# Patient Record
Sex: Male | Born: 1995
Health system: Southern US, Community
[De-identification: ages and names within clinical notes are randomized; demographics above are authoritative.]

## PROBLEM LIST (undated history)

## (undated) DIAGNOSIS — F319 Bipolar disorder, unspecified: Secondary | ICD-10-CM

## (undated) DIAGNOSIS — F4310 Post-traumatic stress disorder, unspecified: Secondary | ICD-10-CM

## (undated) DIAGNOSIS — F32A Depression, unspecified: Secondary | ICD-10-CM

## (undated) DIAGNOSIS — F329 Major depressive disorder, single episode, unspecified: Secondary | ICD-10-CM

---
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
## 2015-06-04 ENCOUNTER — Encounter (HOSPITAL_COMMUNITY): Payer: Self-pay | Admitting: Emergency Medicine

## 2015-06-04 ENCOUNTER — Emergency Department (HOSPITAL_COMMUNITY)
Admission: EM | Admit: 2015-06-04 | Discharge: 2015-06-04 | Disposition: A | Discharge status: Home / Self Care | Payer: Medicaid Other | Attending: Emergency Medicine | Admitting: Emergency Medicine

## 2015-06-04 DIAGNOSIS — F17200 Nicotine dependence, unspecified, uncomplicated: Secondary | ICD-10-CM | POA: Insufficient documentation

## 2015-06-04 DIAGNOSIS — K219 Gastro-esophageal reflux disease without esophagitis: Secondary | ICD-10-CM | POA: Diagnosis not present

## 2015-06-04 DIAGNOSIS — Z8659 Personal history of other mental and behavioral disorders: Secondary | ICD-10-CM | POA: Diagnosis not present

## 2015-06-04 DIAGNOSIS — R1013 Epigastric pain: Secondary | ICD-10-CM | POA: Diagnosis present

## 2015-06-04 LAB — COMPREHENSIVE METABOLIC PANEL
ALBUMIN: 4.2 g/dL (ref 3.5–5.0)
ALK PHOS: 77 U/L (ref 38–126)
ALT: 22 U/L (ref 17–63)
ANION GAP: 9 (ref 5–15)
AST: 29 U/L (ref 15–41)
BILIRUBIN TOTAL: 0.4 mg/dL (ref 0.3–1.2)
BUN: 23 mg/dL — AB (ref 6–20)
CALCIUM: 9.5 mg/dL (ref 8.9–10.3)
CO2: 25 mmol/L (ref 22–32)
CREATININE: 1.05 mg/dL (ref 0.61–1.24)
Chloride: 107 mmol/L (ref 101–111)
GFR calc Af Amer: >60 mL/min (ref >60)
GFR calc non Af Amer: >60 mL/min (ref >60)
GLUCOSE: 103 mg/dL — AB (ref 65–99)
Potassium: 3.9 mmol/L (ref 3.5–5.1)
Sodium: 141 mmol/L (ref 135–145)
TOTAL PROTEIN: 7.6 g/dL (ref 6.5–8.1)

## 2015-06-04 LAB — LIPASE, BLOOD: Lipase: 22 U/L (ref 11–51)

## 2015-06-04 LAB — CBC
HCT: 41.5 % (ref 39.0–52.0)
Hemoglobin: 13.6 g/dL (ref 13.0–17.0)
MCH: 27.1 pg (ref 26.0–34.0)
MCHC: 32.8 g/dL (ref 30.0–36.0)
MCV: 82.8 fL (ref 78.0–100.0)
PLATELETS: 312 10*3/uL (ref 150–400)
RBC: 5.01 MIL/uL (ref 4.22–5.81)
RDW: 12.9 % (ref 11.5–15.5)
WBC: 7.3 10*3/uL (ref 4.0–10.5)

## 2015-06-04 MED ORDER — PANTOPRAZOLE SODIUM 40 MG PO TBEC: 40.0000 mg | DELAYED_RELEASE_TABLET | Freq: Every day | ORAL | Status: AC

## 2015-06-04 MED ORDER — PANTOPRAZOLE SODIUM 40 MG PO TBEC
40.0000 mg | DELAYED_RELEASE_TABLET | Freq: Once | ORAL | Status: AC
  Administered 2015-06-04: 40 mg via ORAL
  Filled 2015-06-04: qty 1

## 2015-06-04 MED ORDER — GI COCKTAIL ~~LOC~~
30.0000 mL | Freq: Once | ORAL | Status: AC
  Administered 2015-06-04: 30 mL via ORAL
  Filled 2015-06-04: qty 30

## 2015-06-04 NOTE — ED Provider Notes (Signed)
CSN: [id number]     Arrival date & time 06/04/15  0259 History   First MD Initiated Contact with Patient 06/04/15 0344     Chief Complaint  Patient presents with  . Abdominal Pain     (Consider location/radiation/quality/duration/timing/severity/associated sxs/prior Treatment) Patient is a 20 y.o. male presenting with abdominal pain. The history is provided by the patient.  Abdominal Pain He complains of discomfort in his epigastric region for about the last 2 months. There is some radiation to the back. He rates his discomfort at 8/10. He describes it as a pressure feeling. It is worse when he lays down and better when he sits up. It is also worse after eating. He has tried drinking tea with no benefit. He has had some mild nausea but no vomiting. Denies constipation or diarrhea. Discomfort has been getting worse or decided to come to the ED. He hasn't tried no other treatments at home. He states he asked she has been gaining weight recently. He smokes cigarettes occasionally. There is no alcohol use and no NSAID use.  Past Medical History  Diagnosis Date  . Bipolar 1 disorder (HCC)   . Depression   . Post traumatic stress disorder    History reviewed. No pertinent past surgical history. History reviewed. No pertinent family history. Social History  Substance Use Topics  . Smoking status: Current Some Day Smoker  . Smokeless tobacco: None  . Alcohol Use: No    Review of Systems  Gastrointestinal: Positive for abdominal pain.  All other systems reviewed and are negative.     Allergies  Review of patient's allergies indicates no known allergies.  Home Medications   Prior to Admission medications   Medication Sig Start Date End Date Taking? Authorizing Provider  ciprofloxacin (CIPRO) 500 MG tablet Take 1 tablet (500 mg total) by mouth 2 (two) times daily. Patient not taking: Reported on 06/04/2015 08/09/14   Teressa Lower, NP  doxycycline (VIBRAMYCIN) 100 MG capsule Take 1  capsule (100 mg total) by mouth 2 (two) times daily. One po bid x 7 days Patient not taking: Reported on 06/04/2015 10/17/14   April Palumbo, MD   BP 128/113 mmHg  Pulse 78  Temp(Src) 97.8 F (36.6 C) (Oral)  Resp 15  Ht  (1.651 m)  Wt 238 lb (107.956 kg)  BMI 39.61 kg/m2  SpO2 98% Physical Exam  Nursing note and vitals reviewed.  20 year old male, resting comfortably and in no acute distress. Vital signs are significant for hypertension. Oxygen saturation is 98%, which is normal. Head is normocephalic and atraumatic. PERRLA, EOMI. Oropharynx is clear. Neck is nontender and supple without adenopathy or JVD. Back is nontender and there is no CVA tenderness. Lungs are clear without rales, wheezes, or rhonchi. Chest is nontender. Heart has regular rate and rhythm without murmur. Abdomen is soft, flat, with moderate epigastric tenderness. There is no rebound or guarding. There are no masses or hepatosplenomegaly and peristalsis is normoactive. Extremities have no cyanosis or edema, full range of motion is present. Skin is warm and dry without rash. Neurologic: Mental status is normal, cranial nerves are intact, there are no motor or sensory deficits.  ED Course  Procedures (including critical care time) Labs Review Results for orders placed or performed during the hospital encounter of 06/04/15  Lipase, blood  Result Value Ref Range   Lipase 22 11 - 51 U/L  Comprehensive metabolic panel  Result Value Ref Range   Sodium 141 135 - 145 mmol/L  Potassium 3.9 3.5 - 5.1 mmol/L   Chloride 107 101 - 111 mmol/L   CO2 25 22 - 32 mmol/L   Glucose, Bld 103 (H) 65 - 99 mg/dL   BUN 23 (H) 6 - 20 mg/dL   Creatinine, Ser 1.61 0.61 - 1.24 mg/dL   Calcium 9.5 8.9 - 09.6 mg/dL   Total Protein 7.6 6.5 - 8.1 g/dL   Albumin 4.2 3.5 - 5.0 g/dL   AST 29 15 - 41 U/L   ALT 22 17 - 63 U/L   Alkaline Phosphatase 77 38 - 126 U/L   Total Bilirubin 0.4 0.3 - 1.2 mg/dL   GFR calc non Af Amer >60 >60  mL/min   GFR calc Af Amer >60 >60 mL/min   Anion gap 9 5 - 15  CBC  Result Value Ref Range   WBC 7.3 4.0 - 10.5 K/uL   RBC 5.01 4.22 - 5.81 MIL/uL   Hemoglobin 13.6 13.0 - 17.0 g/dL   HCT 04.5 40.9 - 81.1 %   MCV 82.8 78.0 - 100.0 fL   MCH 27.1 26.0 - 34.0 pg   MCHC 32.8 30.0 - 36.0 g/dL   RDW 91.4 78.2 - 95.6 %   Platelets 312 150 - 400 K/uL   I have personally reviewed and evaluated these lab results as part of my medical decision-making.   MDM   Final diagnoses:  GERD without esophagitis    Epigastric discomfort in pattern consistent with gastroesophageal reflux disease, possible early peptic ulcer disease. Laboratory workup is unremarkable. He will be given a therapeutic trial of a GI cocktail. Old records are reviewed and there are no relevant past visits.  He had excellent relief with GI cocktail. He is discharged with prescription for pantoprazole.  Dione Booze, MD 06/04/15 [phone]

## 2015-06-04 NOTE — ED Notes (Signed)
Patient complaining of abdominal pain. Patient has had this pain since November. Patient has nausea. No complaint of diarrhea or vomiting.

## 2015-06-04 NOTE — ED Notes (Signed)
Patient requested urine.

## 2015-06-04 NOTE — ED Notes (Signed)
Bed: [facility] Expected date:  Expected time:  Means of arrival:  Comments: EMS 44M abd pain x 1 month

## 2015-06-04 NOTE — Discharge Instructions (Signed)
Gastroesophageal Reflux Disease, Adult Normally, food travels down the esophagus and stays in the stomach to be digested. However, when a person has gastroesophageal reflux disease (GERD), food and stomach acid move back up into the esophagus. When this happens, the esophagus becomes sore and inflamed. Over time, GERD can create small holes (ulcers) in the lining of the esophagus.  CAUSES This condition is caused by a problem with the muscle between the esophagus and the stomach (lower esophageal sphincter, or LES). Normally, the LES muscle closes after food passes through the esophagus to the stomach. When the LES is weakened or abnormal, it does not close properly, and that allows food and stomach acid to go back up into the esophagus. The LES can be weakened by certain dietary substances, medicines, and medical conditions, including:  Tobacco use.  Pregnancy.  Having a hiatal hernia.  Heavy alcohol use.  Certain foods and beverages, such as coffee, chocolate, onions, and peppermint. RISK FACTORS This condition is more likely to develop in:  People who have an increased body weight.  People who have connective tissue disorders.  People who use NSAID medicines. SYMPTOMS Symptoms of this condition include:  Heartburn.  Difficult or painful swallowing.  The feeling of having a lump in the throat.  Abitter taste in the mouth.  Bad breath.  Having a large amount of saliva.  Having an upset or bloated stomach.  Belching.  Chest pain.  Shortness of breath or wheezing.  Ongoing (chronic) cough or a night-time cough.  Wearing away of tooth enamel.  Weight loss. Different conditions can cause chest pain. Make sure to see your health care provider if you experience chest pain. DIAGNOSIS Your health care provider will take a medical history and perform a physical exam. To determine if you have mild or severe GERD, your health care provider may also monitor how you respond  to treatment. You may also have other tests, including:  An endoscopy toexamine your stomach and esophagus with a small camera.  A test thatmeasures the acidity level in your esophagus.  A test thatmeasures how much pressure is on your esophagus.  A barium swallow or modified barium swallow to show the shape, size, and functioning of your esophagus. TREATMENT The goal of treatment is to help relieve your symptoms and to prevent complications. Treatment for this condition may vary depending on how severe your symptoms are. Your health care provider may recommend:  Changes to your diet.  Medicine.  Surgery. HOME CARE INSTRUCTIONS Diet  Follow a diet as recommended by your health care provider. This may involve avoiding foods and drinks such as:  Coffee and tea (with or without caffeine).  Drinks that containalcohol.  Energy drinks and sports drinks.  Carbonated drinks or sodas.  Chocolate and cocoa.  Peppermint and mint flavorings.  Garlic and onions.  Horseradish.  Spicy and acidic foods, including peppers, chili powder, curry powder, vinegar, hot sauces, and barbecue sauce.  Citrus fruit juices and citrus fruits, such as oranges, lemons, and limes.  Tomato-based foods, such as red sauce, chili, salsa, and pizza with red sauce.  Fried and fatty foods, such as donuts, french fries, potato chips, and high-fat dressings.  High-fat meats, such as hot dogs and fatty cuts of red and white meats, such as rib eye steak, sausage, ham, and bacon.  High-fat dairy items, such as whole milk, butter, and cream cheese.  Eat small, frequent meals instead of large meals.  Avoid drinking large amounts of liquid with your   meals.  Avoid eating meals during the 2-3 hours before bedtime.  Avoid lying down right after you eat.  Do not exercise right after you eat. General Instructions  Pay attention to any changes in your symptoms.  Take over-the-counter and prescription  medicines only as told by your health care provider. Do not take aspirin, ibuprofen, or other NSAIDs unless your health care provider told you to do so.  Do not use any tobacco products, including cigarettes, chewing tobacco, and e-cigarettes. If you need help quitting, ask your health care provider.  Wear loose-fitting clothing. Do not wear anything tight around your waist that causes pressure on your abdomen.  Raise (elevate) the head of your bed 6 inches (15cm).  Try to reduce your stress, such as with yoga or meditation. If you need help reducing stress, ask your health care provider.  If you are overweight, reduce your weight to an amount that is healthy for you. Ask your health care provider for guidance about a safe weight loss goal.  Keep all follow-up visits as told by your health care provider. This is important. SEEK MEDICAL CARE IF:  You have new symptoms.  You have unexplained weight loss.  You have difficulty swallowing, or it hurts to swallow.  You have wheezing or a persistent cough.  Your symptoms do not improve with treatment.  You have a hoarse voice. SEEK IMMEDIATE MEDICAL CARE IF:  You have pain in your arms, neck, jaw, teeth, or back.  You feel sweaty, dizzy, or light-headed.  You have chest pain or shortness of breath.  You vomit and your vomit looks like blood or coffee grounds.  You faint.  Your stool is bloody or black.  You cannot swallow, drink, or eat.   This information is not intended to replace advice given to you by your health care provider. Make sure you discuss any questions you have with your health care provider.   Document Released: 02/07/2005 Document Revised: 01/19/2015 Document Reviewed: 08/25/2014 Elsevier Interactive Patient Education 2016 [employer]  Pantoprazole tablets What is this medicine? PANTOPRAZOLE (pan TOE pra zole) prevents the production of acid in the stomach. It is used to treat gastroesophageal reflux  disease (GERD), inflammation of the esophagus, and Zollinger-Ellison syndrome. This medicine may be used for other purposes; ask your health care provider or pharmacist if you have questions. What should I tell my health care provider before I take this medicine? They need to know if you have any of these conditions: -liver disease -low levels of magnesium in the blood -an unusual or allergic reaction to omeprazole, lansoprazole, pantoprazole, rabeprazole, other medicines, foods, dyes, or preservatives -pregnant or trying to get pregnant -breast-feeding How should I use this medicine? Take this medicine by mouth. Swallow the tablets whole with a drink of water. Follow the directions on the prescription label. Do not crush, break, or chew. Take your medicine at regular intervals. Do not take your medicine more often than directed. Talk to your pediatrician regarding the use of this medicine in children. While this drug may be prescribed for children as young as 5 years for selected conditions, precautions do apply. Overdosage: If you think you have taken too much of this medicine contact a poison control center or emergency room at once. NOTE: This medicine is only for you. Do not share this medicine with others. What if I miss a dose? If you miss a dose, take it as soon as you can. If it is almost time for your   next dose, take only that dose. Do not take double or extra doses. What may interact with this medicine? Do not take this medicine with any of the following medications: -atazanavir -nelfinavir This medicine may also interact with the following medications: -ampicillin -delavirdine -erlotinib -iron salts -medicines for fungal infections like ketoconazole, itraconazole and voriconazole -methotrexate -mycophenolate mofetil -warfarin This list may not describe all possible interactions. Give your health care provider a list of all the medicines, herbs, non-prescription drugs, or  dietary supplements you use. Also tell them if you smoke, drink alcohol, or use illegal drugs. Some items may interact with your medicine. What should I watch for while using this medicine? It can take several days before your stomach pain gets better. Check with your doctor or health care professional if your condition does not start to get better, or if it gets worse. You may need blood work done while you are taking this medicine. What side effects may I notice from receiving this medicine? Side effects that you should report to your doctor or health care professional as soon as possible: -allergic reactions like skin rash, itching or hives, swelling of the face, lips, or tongue -bone, muscle or joint pain -breathing problems -chest pain or chest tightness -dark yellow or brown urine -dizziness -fast, irregular heartbeat -feeling faint or lightheaded -fever or sore throat -muscle spasm -palpitations -redness, blistering, peeling or loosening of the skin, including inside the mouth -seizures -tremors -unusual bleeding or bruising -unusually weak or tired -yellowing of the eyes or skin Side effects that usually do not require medical attention (Report these to your doctor or health care professional if they continue or are bothersome.): -constipation -diarrhea -dry mouth -headache -nausea This list may not describe all possible side effects. Call your doctor for medical advice about side effects. You may report side effects to FDA at 1-800-FDA-1088. Where should I keep my medicine? Keep out of the reach of children. Store at room temperature between 15 and 30 degrees C (59 and 86 degrees F). Protect from light and moisture. Throw away any unused medicine after the expiration date. NOTE: This sheet is a summary. It may not cover all possible information. If you have questions about this medicine, talk to your doctor, pharmacist, or health care provider.    2016, Elsevier/Gold  Standard. (2014-06-18 14:45:56)  

## 2016-05-12 ENCOUNTER — Emergency Department (HOSPITAL_BASED_OUTPATIENT_CLINIC_OR_DEPARTMENT_OTHER)
Admission: EM | Admit: 2016-05-12 | Discharge: 2016-05-12 | Discharge status: Against Medical Advice | Payer: Medicaid Other

## 2016-05-12 ENCOUNTER — Encounter (HOSPITAL_COMMUNITY): Payer: Self-pay

## 2016-05-12 DIAGNOSIS — R197 Diarrhea, unspecified: Secondary | ICD-10-CM | POA: Diagnosis present

## 2016-05-12 DIAGNOSIS — F17200 Nicotine dependence, unspecified, uncomplicated: Secondary | ICD-10-CM | POA: Diagnosis not present

## 2016-05-12 DIAGNOSIS — Z79899 Other long term (current) drug therapy: Secondary | ICD-10-CM | POA: Diagnosis not present

## 2016-05-12 LAB — COMPREHENSIVE METABOLIC PANEL
ALK PHOS: 75 U/L (ref 38–126)
ALT: 40 U/L (ref 17–63)
AST: 29 U/L (ref 15–41)
Albumin: 4.5 g/dL (ref 3.5–5.0)
Anion gap: 7 (ref 5–15)
BILIRUBIN TOTAL: 0.4 mg/dL (ref 0.3–1.2)
BUN: 23 mg/dL — ABNORMAL HIGH (ref 6–20)
CALCIUM: 9.3 mg/dL (ref 8.9–10.3)
CO2: 27 mmol/L (ref 22–32)
CREATININE: 0.83 mg/dL (ref 0.61–1.24)
Chloride: 105 mmol/L (ref 101–111)
GFR calc non Af Amer: >60 mL/min (ref >60)
GLUCOSE: 91 mg/dL (ref 65–99)
Potassium: 3.2 mmol/L — ABNORMAL LOW (ref 3.5–5.1)
SODIUM: 139 mmol/L (ref 135–145)
TOTAL PROTEIN: 8.1 g/dL (ref 6.5–8.1)

## 2016-05-12 LAB — CBC
HCT: 44.2 % (ref 39.0–52.0)
Hemoglobin: 14.7 g/dL (ref 13.0–17.0)
MCH: 26.8 pg (ref 26.0–34.0)
MCHC: 33.3 g/dL (ref 30.0–36.0)
MCV: 80.7 fL (ref 78.0–100.0)
PLATELETS: 300 10*3/uL (ref 150–400)
RBC: 5.48 MIL/uL (ref 4.22–5.81)
RDW: 13 % (ref 11.5–15.5)
WBC: 6.9 10*3/uL (ref 4.0–10.5)

## 2016-05-12 LAB — LIPASE, BLOOD: Lipase: 14 U/L (ref 11–51)

## 2016-05-12 NOTE — ED Triage Notes (Signed)
PT C/O WATERY DIARRHEA X2 DAYS AFTER EATING "BAD CHICKEN" 2 DAYS IN A ROW. PT STS THERE HAS BEEN OTHER CASES OF POSSIBLE FOOD POISONING AT THE [employer] WHERE HE WORKS [place] [employer]. PT DENIES FEVER OR ABDOMINAL PAIN.

## 2016-05-13 ENCOUNTER — Emergency Department (HOSPITAL_COMMUNITY)
Admission: EM | Admit: 2016-05-13 | Discharge: 2016-05-13 | Disposition: A | Discharge status: Home / Self Care | Payer: Medicaid Other | Attending: Emergency Medicine | Admitting: Emergency Medicine

## 2016-05-13 DIAGNOSIS — R197 Diarrhea, unspecified: Secondary | ICD-10-CM

## 2016-05-13 LAB — URINALYSIS, ROUTINE W REFLEX MICROSCOPIC
Bilirubin Urine: NEGATIVE
GLUCOSE, UA: NEGATIVE mg/dL
Hgb urine dipstick: NEGATIVE
Ketones, ur: NEGATIVE mg/dL
LEUKOCYTES UA: NEGATIVE
Nitrite: NEGATIVE
PH: 6 (ref 5.0–8.0)
Protein, ur: NEGATIVE mg/dL
Specific Gravity, Urine: 1.033 — ABNORMAL HIGH (ref 1.005–1.030)

## 2016-05-13 MED ORDER — LACTINEX PO PACK: PACK | ORAL | 0 refills | Status: AC

## 2016-05-13 MED ORDER — PROMETHAZINE HCL 25 MG PO TABS: 25.0000 mg | ORAL_TABLET | Freq: Four times a day (QID) | ORAL | 0 refills | Status: AC | PRN

## 2016-05-13 NOTE — Discharge Instructions (Signed)
Continues to drink plenty of clear liquids such as water to prevent dehydration. Use Lactinex as prescribed to try and help alleviate diarrhea. You may take Phenergan as needed for nausea. Follow-up with a primary care doctor regarding your visit today. You may return, as needed, for new or concerning symptoms.

## 2016-05-13 NOTE — ED Provider Notes (Signed)
[facility]-EM[facility]ENC[facility] DEPT Provider Note   CSN: [id number] Arrival date & time: 05/12/16  2136    History   Chief Complaint Chief Complaint  Patient presents with  . Diarrhea    HPI Antonio Maldonado is a 20 y.o. male.  Patient with diarrhea after eating chicken wings at work 2 days in a row. He reports possible food poisoning at [employer] [place]. No abdominal pain. He states that symptoms have started to spontaneously improve. He has tolerated Cheetos, a Twix bar, and a Snickers bar PTA.   The history is provided by the patient. No language interpreter was used.  Diarrhea   This is a new problem. The current episode started yesterday. The problem occurs 5 to 10 times per day. The problem has been gradually improving. The stool consistency is described as watery. There has been no fever. Pertinent negatives include no abdominal pain, no vomiting, no chills, no sweats and no cough. He has tried nothing for the symptoms. Risk factors include suspect food intake.    Past Medical History:  Diagnosis Date  . Bipolar 1 disorder (HCC)   . Depression   . Post traumatic stress disorder     There are no active problems to display for this patient.   History reviewed. No pertinent surgical history.     Home Medications    Prior to Admission medications   Medication Sig Start Date End Date Taking? Authorizing Provider  Lactobacillus (LACTINEX) PACK Mix 1/2 packet with soft food and take twice a day for 5 days. 05/13/16   Antony MaduraKelly Kohle Winner, PA-C  pantoprazole (PROTONIX) 40 MG tablet Take 1 tablet (40 mg total) by mouth daily. Patient not taking: Reported on 05/13/2016 06/04/15   Dione Boozeavid Glick, MD  promethazine (PHENERGAN) 25 MG tablet Take 1 tablet (25 mg total) by mouth every 6 (six) hours as needed for nausea or vomiting. 05/13/16   Antony MaduraKelly Nachman Sundt, PA-C    Family History History reviewed. No pertinent family history.  Social History Social History  Substance Use Topics  . Smoking  status: Current Some Day Smoker  . Smokeless tobacco: Never Used  . Alcohol use No     Allergies   Patient has no known allergies.   Review of Systems Review of Systems  Constitutional: Negative for chills.  Respiratory: Negative for cough.   Gastrointestinal: Positive for diarrhea. Negative for abdominal pain and vomiting.   Ten systems reviewed and are negative for acute change, except as noted in the HPI.    Physical Exam Updated Vital Signs BP (!) 126/104 (BP Location: Left Arm)   Pulse 68   Temp 97.7 F (36.5 C) (Oral)   Resp 16   Ht 5\' 3"  (1.6 m)   Wt 114.5 kg   SpO2 95%   BMI 44.73 kg/m   Physical Exam  Constitutional: He is oriented to person, place, and time. He appears well-developed and well-nourished. No distress.  Nontoxic/nonseptic appearing  HENT:  Head: Normocephalic and atraumatic.  Eyes: Conjunctivae and EOM are normal. No scleral icterus.  Neck: Normal range of motion.  Cardiovascular: Normal rate, regular rhythm and intact distal pulses.   Pulmonary/Chest: Effort normal. No respiratory distress. He has no wheezes. He has no rales.  Abdominal: Soft. He exhibits no distension and no mass. There is no tenderness. There is no guarding.  Soft, nontender abdomen. No masses or peritoneal signs.  Musculoskeletal: Normal range of motion.  Neurological: He is alert and oriented to person, place, and time. He exhibits  normal muscle tone. Coordination normal.  Skin: Skin is warm and dry. No rash noted. He is not diaphoretic. No erythema. No pallor.  Psychiatric: He has a normal mood and affect. His behavior is normal.  Nursing note and vitals reviewed.    ED Treatments / Results  Labs (all labs ordered are listed, but only abnormal results are displayed) Labs Reviewed  COMPREHENSIVE METABOLIC PANEL - Abnormal; Notable for the following:       Result Value   Potassium 3.2 (*)    BUN 23 (*)    All other components within normal limits  URINALYSIS,  ROUTINE W REFLEX MICROSCOPIC - Abnormal; Notable for the following:    Specific Gravity, Urine 1.033 (*)    All other components within normal limits  LIPASE, BLOOD  CBC    EKG  EKG Interpretation None       Radiology No results found.  Procedures Procedures (including critical care time)  Medications Ordered in ED Medications - No data to display   Initial Impression / Assessment and Plan / ED Course  I have reviewed the triage vital signs and the nursing notes.  Pertinent labs & imaging results that were available during my care of the patient were reviewed by me and considered in my medical decision making (see chart for details).  Clinical Course     Patient with diarrhea, suspected secondary to food intake. Symptoms have started to spontaneously resolve. Vitals are stable, no fever or leukocytosis. Patient tolerating POs. Lungs are clear. No focal abdominal pain or tenderness. Supportive therapy indicated with return if symptoms worsen. Patient discharged in stable condition with no unaddressed concerns.   Final Clinical Impressions(s) / ED Diagnoses   Final diagnoses:  Diarrhea, unspecified type    New Prescriptions Discharge Medication List as of 05/13/2016  4:03 AM    START taking these medications   Details  Lactobacillus (LACTINEX) PACK Mix 1/2 packet with soft food and take twice a day for 5 days., Print    promethazine (PHENERGAN) 25 MG tablet Take 1 tablet (25 mg total) by mouth every 6 (six) hours as needed for nausea or vomiting., Starting Sun 05/13/2016, Print         Antony MaduraKelly Prisma Decarlo, PA-C 05/13/16 0424    Tomasita CrumbleAdeleke Oni, MD 05/13/16 1400

## 2020-01-27 ENCOUNTER — Other Ambulatory Visit: Payer: Self-pay

## 2020-01-27 ENCOUNTER — Telehealth (INDEPENDENT_AMBULATORY_CARE_PROVIDER_SITE_OTHER): Payer: No Payment, Other | Admitting: Psychiatry

## 2020-01-27 ENCOUNTER — Encounter (HOSPITAL_COMMUNITY): Payer: Self-pay | Admitting: Psychiatry

## 2020-01-27 DIAGNOSIS — F900 Attention-deficit hyperactivity disorder, predominantly inattentive type: Secondary | ICD-10-CM | POA: Diagnosis not present

## 2020-01-27 DIAGNOSIS — F411 Generalized anxiety disorder: Secondary | ICD-10-CM | POA: Diagnosis not present

## 2020-01-27 DIAGNOSIS — F331 Major depressive disorder, recurrent, moderate: Secondary | ICD-10-CM

## 2020-01-27 MED ORDER — ATOMOXETINE HCL 40 MG PO CAPS: 40.0000 mg | ORAL_CAPSULE | Freq: Every day | ORAL | 2 refills | Status: DC

## 2020-01-27 MED ORDER — BUPROPION HCL ER (XL) 150 MG PO TB24: 150.0000 mg | ORAL_TABLET | ORAL | 2 refills | Status: DC

## 2020-01-27 MED ORDER — HYDROXYZINE HCL 10 MG PO TABS: 10.0000 mg | ORAL_TABLET | Freq: Three times a day (TID) | ORAL | 2 refills | Status: DC | PRN

## 2020-01-27 MED ORDER — TRAZODONE HCL 50 MG PO TABS: 50.0000 mg | ORAL_TABLET | Freq: Every evening | ORAL | 2 refills | Status: DC | PRN
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
## 2020-02-23 ENCOUNTER — Ambulatory Visit (HOSPITAL_COMMUNITY): Payer: No Payment, Other | Admitting: Psychiatry

## 2020-04-27 ENCOUNTER — Telehealth (INDEPENDENT_AMBULATORY_CARE_PROVIDER_SITE_OTHER): Payer: No Payment, Other | Admitting: Psychiatry

## 2020-04-27 ENCOUNTER — Other Ambulatory Visit: Payer: Self-pay

## 2020-04-27 ENCOUNTER — Other Ambulatory Visit (HOSPITAL_COMMUNITY): Payer: Self-pay | Admitting: Psychiatry

## 2020-04-27 ENCOUNTER — Encounter (HOSPITAL_COMMUNITY): Payer: Self-pay | Admitting: Psychiatry

## 2020-04-27 DIAGNOSIS — F900 Attention-deficit hyperactivity disorder, predominantly inattentive type: Secondary | ICD-10-CM

## 2020-04-27 DIAGNOSIS — F411 Generalized anxiety disorder: Secondary | ICD-10-CM

## 2020-04-27 DIAGNOSIS — F331 Major depressive disorder, recurrent, moderate: Secondary | ICD-10-CM

## 2020-04-27 MED ORDER — BUPROPION HCL ER (XL) 150 MG PO TB24: 150.0000 mg | ORAL_TABLET | ORAL | 2 refills | Status: DC

## 2020-04-27 MED ORDER — ATOMOXETINE HCL 40 MG PO CAPS: 40.0000 mg | ORAL_CAPSULE | Freq: Every day | ORAL | 2 refills | Status: DC

## 2020-04-27 MED ORDER — HYDROXYZINE HCL 10 MG PO TABS: 10.0000 mg | ORAL_TABLET | Freq: Three times a day (TID) | ORAL | 2 refills | Status: DC | PRN

## 2020-04-27 MED ORDER — TRAZODONE HCL 50 MG PO TABS: 50.0000 mg | ORAL_TABLET | Freq: Every evening | ORAL | 2 refills | Status: DC | PRN

## 2020-04-27 MED FILL — BUPROPION HCL XL 150 MG TAB: 150 | 30 days supply | Qty: 30 | Fill #0

## 2020-04-27 MED FILL — traZODone HCL 50 MG TABS: 50 | 30 days supply | Qty: 30 | Fill #0

## 2020-04-27 MED FILL — hydrOXYzine HCL 10 MG TABS: 10 | 10 days supply | Qty: 30 | Fill #0

## 2020-04-27 NOTE — Progress Notes (Signed)
BH MD/PA/NP OP Progress Note Virtual Visit via Video Note  I connected with Antonio Maldonado on 04/27/20 at 11:00 AM EST by a video enabled telemedicine application and verified that I am speaking with the correct person using two identifiers.  Location: Patient: Home Provider: Clinic   I discussed the limitations of evaluation and management by telemedicine and the availability of in person appointments. The patient expressed understanding and agreed to proceed.  I provided 30 minutes of non-face-to-face time during this encounter.    04/27/2020 11:28 AM Antonio Maldonado  MRN:  [id number]  Chief Complaint:  "My medicaid didn't cover my medication"  HPI:  24 year old male seen today for follow up psychiatric evaluation.  He has a psychiatric history of PTSD, bipolar disorder, and ADHD.  He is currently managed on Strattera 40 mg daily, Wellbutrin XL 150 mg daily, hydroxyzine 10 mg 3 times daily, and trazodone 50 mg nightly.  He notes that since his last visit he was unable to pick up his medications because his insurance did not cover it at his pharmacy.  He notes that he has been experiencing some depression and anxiety however notes that overall he is doing well.   Today he is well-groomed, pleasant, cooperative, engaged in conversation, and maintained eye contact.  He informed Clinical research associate that he is anxious and depressed at times.  Provider conducted a GAD-7 and patient scored a 12.  Provider also conducted a PHQ-9 and patient scored a 9.  He notes that although he does experience anxiety and depression things have been looking up in his life.  He notes that he now works at [employer] and has a upcoming job interview.  He notes that he is hopeful that he will get the other position as he wants to support his girlfriend who is currently pregnant with their child.  He notes that he is now living with his father and feels more stable.  Today he denies symptoms of mania, SI/HI/VH or paranoia.  He also notes that he  is sleeping 6 to 7 hours nightly.  Patient agreeable [facility]  Patient agreeable to restart Strattera 40 mg daily, Wellbutrin 150 mg daily, hydroxyzine 10 mg 3 times daily, and trazodone 50 mg nightly as needed. Potential side effects of medication and risks vs benefits of treatment vs non-treatment were explained and discussed. All questions were answered.  He will follow-up with provider in 3 months.  No other concerns noted at this time.  . Visit Diagnosis:    ICD-10-CM   1. Attention deficit hyperactivity disorder (ADHD), predominantly inattentive type  F90.0 atomoxetine (STRATTERA) 40 MG capsule    buPROPion (WELLBUTRIN XL) 150 MG 24 hr tablet  2. Moderate episode of recurrent major depressive disorder (HCC)  F33.1 buPROPion (WELLBUTRIN XL) 150 MG 24 hr tablet    traZODone (DESYREL) 50 MG tablet  3. Generalized anxiety disorder  F41.1 hydrOXYzine (ATARAX/VISTARIL) 10 MG tablet    Past Psychiatric History:  PTSD, bipolar disorder, and ADHD  Past Medical History:  Past Medical History:  Diagnosis Date  . Bipolar 1 disorder (HCC)   . Depression   . Post traumatic stress disorder    No past surgical history on file.  Family Psychiatric History: Denies (notes that he was in foster care until 3 and then he was adopted however notes that it didn't work out)  Family History: No family history on file.  Social History:  Social History   Socioeconomic History  .  Marital status: Single    Spouse name: Not on file  . Number of children: Not on file  . Years of education: Not on file  . Highest education level: Not on file  Occupational History  . Not on file  Tobacco Use  . Smoking status: Current Some Day Smoker  . Smokeless tobacco: Never Used  Substance and Sexual Activity  . Alcohol use: No  . Drug use: No  . Sexual activity: Not on file  Other Topics Concern  . Not on file  Social History Narrative  . Not on file    Social Determinants of Health   Financial Resource Strain: Not on file  Food Insecurity: Not on file  Transportation Needs: Not on file  Physical Activity: Not on file  Stress: Not on file  Social Connections: Not on file    Allergies: No Known Allergies  Metabolic Disorder Labs: No results found for: HGBA1C, MPG No results found for: PROLACTIN No results found for: CHOL, TRIG, HDL, CHOLHDL, VLDL, LDLCALC No results found for: TSH  Therapeutic Level Labs: No results found for: LITHIUM Lab Results  Component Value Date   VALPROATE 57.2 06/20/2012   No components found for:  CBMZ  Current Medications: Current Outpatient Medications  Medication Sig Dispense Refill  . atomoxetine (STRATTERA) 40 MG capsule Take 1 capsule (40 mg total) by mouth daily. 30 capsule 2  . buPROPion (WELLBUTRIN XL) 150 MG 24 hr tablet Take 1 tablet (150 mg total) by mouth every morning. 30 tablet 2  . hydrOXYzine (ATARAX/VISTARIL) 10 MG tablet Take 1 tablet (10 mg total) by mouth 3 (three) times daily as needed. 30 tablet 2  . Lactobacillus (LACTINEX) PACK Mix 1/2 packet with soft food and take twice a day for 5 days. 5 each 0  . pantoprazole (PROTONIX) 40 MG tablet Take 1 tablet (40 mg total) by mouth daily. (Patient not taking: Reported on 05/13/2016) 30 tablet 0  . promethazine (PHENERGAN) 25 MG tablet Take 1 tablet (25 mg total) by mouth every 6 (six) hours as needed for nausea or vomiting. 12 tablet 0  . traZODone (DESYREL) 50 MG tablet Take 1 tablet (50 mg total) by mouth at bedtime as needed for sleep. 30 tablet 2   No current facility-administered medications for this visit.     Musculoskeletal: Strength & Muscle Tone: Unable to assess due to telehealth visit Gait & Station: Unable to assess due to telehealth visit Patient leans: N/A  Psychiatric Specialty Exam: Review of Systems  There were no vitals taken for this visit.There is no height or weight on file to calculate BMI.  General  Appearance: Well Groomed  Eye Contact:  Good  Speech:  Clear and Coherent and Normal Rate  Volume:  Normal  Mood:  Euthymic and Patient endorses occasional anxiety and depression  Affect:  Appropriate and Congruent  Thought Process:  Coherent, Goal Directed and Linear  Orientation:  Full (Time, Place, and Person)  Thought Content: WDL and Logical   Suicidal Thoughts:  No  Homicidal Thoughts:  No  Memory:  Immediate;   Good Recent;   Good Remote;   Good  Judgement:  Good  Insight:  Good  Psychomotor Activity:  Normal  Concentration:  Concentration: Good and Attention Span: Good  Recall:  Good  Fund of Knowledge: Good  Language: Good  Akathisia:  No  Handed:  Right  AIMS (if indicated): Not done  Assets:  Communication Skills Desire for Improvement Financial Resources/Insurance Housing Social Support  ADL's:  Intact  Cognition: WNL  Sleep:  Good   Screenings: GAD-7   Flowsheet Row [facility]27/2020 [facility]  Total GAD-7 Score 12    PHQ2-9   Flowsheet Row Video Visit [facility] 04/27/2020 in [facility]  PHQ-2 Total Score 0  PHQ-9 Total Score 9       Assessment and Plan: Patient notes that he is occasionally anxious and depressed.  He notes that he did not start his medication because his joints would not cover them.  [facility].Patient agreeable to restart Strattera 40 mg daily, Wellbutrin 150 mg daily, hydroxyzine 10 mg 3 times daily, and trazodone 50 mg nightly as need  1. Attention deficit hyperactivity disorder (ADHD), predominantly inattentive type  Restart- atomoxetine (STRATTERA) 40 MG capsule; Take 1 capsule (40 mg total) by mouth daily.  Dispense: 30 capsule; Refill: 2 Restart- buPROPion (WELLBUTRIN XL) 150 MG 24 hr tablet; Take 1 tablet (150 mg total) by mouth every morning.  Dispense: 30 tablet; Refill: 2  2. Moderate episode of recurrent  major depressive disorder (HCC)  Restart- buPROPion (WELLBUTRIN XL) 150 MG 24 hr tablet; Take 1 tablet (150 mg total) by mouth every morning.  Dispense: 30 tablet; Refill: 2 Restart- traZODone (DESYREL) 50 MG tablet; Take 1 tablet (50 mg total) by mouth at bedtime as needed for sleep.  Dispense: 30 tablet; Refill: 2  3. Generalized anxiety disorder  Restart- hydrOXYzine (ATARAX/VISTARIL) 10 MG tablet; Take 1 tablet (10 mg total) by mouth 3 (three) times daily as needed.  Dispense: 30 tablet; Refill: 2  Follow-up in 3 months Shanna Cisco, NP 04/27/2020, 11:28 AM

## 2020-04-28 MED FILL — ATOMOXETINE HCL 40 MG CAPS: 40 | 30 days supply | Qty: 30 | Fill #0

## 2020-05-05 MED FILL — BUPROPION HCL XL 150 MG TAB: 150 | 30 days supply | Qty: 30 | Fill #0

## 2020-05-05 MED FILL — ATOMOXETINE HCL 40 MG CAPS: 40 | 30 days supply | Qty: 30 | Fill #0

## 2020-05-05 MED FILL — traZODone HCL 50 MG TABS: 50 | 30 days supply | Qty: 30 | Fill #0

## 2020-05-05 MED FILL — hydrOXYzine HCL 10 MG TABS: 10 | 10 days supply | Qty: 30 | Fill #0

## 2020-07-26 ENCOUNTER — Telehealth (INDEPENDENT_AMBULATORY_CARE_PROVIDER_SITE_OTHER): Payer: No Payment, Other | Admitting: Psychiatry

## 2020-07-26 ENCOUNTER — Other Ambulatory Visit (HOSPITAL_COMMUNITY): Payer: Self-pay | Admitting: Psychiatry

## 2020-07-26 ENCOUNTER — Encounter (HOSPITAL_COMMUNITY): Payer: Self-pay | Admitting: Psychiatry

## 2020-07-26 ENCOUNTER — Other Ambulatory Visit: Payer: Self-pay

## 2020-07-26 DIAGNOSIS — F900 Attention-deficit hyperactivity disorder, predominantly inattentive type: Secondary | ICD-10-CM | POA: Diagnosis not present

## 2020-07-26 DIAGNOSIS — F411 Generalized anxiety disorder: Secondary | ICD-10-CM | POA: Diagnosis not present

## 2020-07-26 DIAGNOSIS — F331 Major depressive disorder, recurrent, moderate: Secondary | ICD-10-CM | POA: Diagnosis not present

## 2020-07-26 MED ORDER — ATOMOXETINE HCL 40 MG PO CAPS: 40.0000 mg | ORAL_CAPSULE | Freq: Every day | ORAL | 2 refills | Status: DC

## 2020-07-26 MED ORDER — TRAZODONE HCL 50 MG PO TABS: 50.0000 mg | ORAL_TABLET | Freq: Every evening | ORAL | 2 refills | Status: DC | PRN

## 2020-07-26 MED ORDER — BUPROPION HCL ER (XL) 150 MG PO TB24: 150.0000 mg | ORAL_TABLET | ORAL | 2 refills | Status: DC

## 2020-07-26 MED ORDER — HYDROXYZINE HCL 10 MG PO TABS: 10.0000 mg | ORAL_TABLET | Freq: Three times a day (TID) | ORAL | 2 refills | Status: DC | PRN

## 2020-07-26 NOTE — Progress Notes (Signed)
BH MD/PA/NP OP Progress Note  Virtual Visit via Telephone Note  I connected with Antonio Maldonado on 07/26/20 at  3:00 PM EDT by telephone and verified that I am speaking with the correct person using two identifiers.  Location: Patient: home Provider: Clinic   I discussed the limitations, risks, security and privacy concerns of performing an evaluation and management service by telephone and the availability of in person appointments. I also discussed with the patient that there may be a patient responsible charge related to this service. The patient expressed understanding and agreed to proceed.   I provided 30 minutes of non-face-to-face time during this encounter.      07/26/2020 1:48 PM Antonio Maldonado  MRN:  [id number]  Chief Complaint:  "Everything has been okay with my medications"  HPI:  25 year old male seen today for follow up psychiatric evaluation.  He has a psychiatric history of PTSD, bipolar disorder, and ADHD.  He is currently managed on Strattera 40 mg daily, Wellbutrin XL 150 mg daily, hydroxyzine 10 mg 3 times daily, and trazodone 50 mg nightly.  He notes that his medications are effective in managing his psychiatric conditions.   Today he was unable to login virtually so his exam was done over the phone. During assessment he was pleasant, cooperative, and engaged in conversation.  He informed Clinical research associate that since his last visit his anxiety, depression, and sleep has improved. He notes that he feels more level headed and less impulsive. Today provider conducted a GAD-7 and patient scored a 0, at his last visit he scored a 12.  Provider also conducted a PHQ-9 and patient scored a 0, at his last visit he scored a  9. He endorses adequate sleep and appetite. Today he denies symptoms of mania, SI/HI/VAH or paranoia.  Patient notes that he is excited about the birth of his son who will be born in April. He notes that he recently started working at [employer] and is saving money for his child.    No medication changes made today. Patient agreeable to continue medications as prescribed. No other concerns noted at this time.   . Visit Diagnosis:    ICD-10-CM   1. Attention deficit hyperactivity disorder (ADHD), predominantly inattentive type  F90.0 atomoxetine (STRATTERA) 40 MG capsule    buPROPion (WELLBUTRIN XL) 150 MG 24 hr tablet  2. Moderate episode of recurrent major depressive disorder (HCC)  F33.1 buPROPion (WELLBUTRIN XL) 150 MG 24 hr tablet    traZODone (DESYREL) 50 MG tablet  3. Generalized anxiety disorder  F41.1 hydrOXYzine (ATARAX/VISTARIL) 10 MG tablet    Past Psychiatric History:  PTSD, bipolar disorder, and ADHD  Past Medical History:  Past Medical History:  Diagnosis Date  . Bipolar 1 disorder (HCC)   . Depression   . Post traumatic stress disorder    No past surgical history on file.  Family Psychiatric History: Denies (notes that he was in foster care until 3 and then he was adopted however notes that it didn't work out)  Family History: No family history on file.  Social History:  Social History   Socioeconomic History  . Marital status: Single    Spouse name: Not on file  . Number of children: Not on file  . Years of education: Not on file  . Highest education level: Not on file  Occupational History  . Not on file  Tobacco Use  . Smoking status: Current Some Day Smoker  . Smokeless tobacco: Never Used  Substance and Sexual Activity  .  Alcohol use: No  . Drug use: No  . Sexual activity: Not on file  Other Topics Concern  . Not on file  Social History Narrative  . Not on file   Social Determinants of Health   Financial Resource Strain: Not on file  Food Insecurity: Not on file  Transportation Needs: Not on file  Physical Activity: Not on file  Stress: Not on file  Social Connections: Not on file    Allergies: No Known Allergies  Metabolic Disorder Labs: No results found for: HGBA1C, MPG No results found for: PROLACTIN No  results found for: CHOL, TRIG, HDL, CHOLHDL, VLDL, LDLCALC No results found for: TSH  Therapeutic Level Labs: No results found for: LITHIUM Lab Results  Component Value Date   VALPROATE 57.2 06/20/2012   No components found for:  CBMZ  Current Medications: Current Outpatient Medications  Medication Sig Dispense Refill  . atomoxetine (STRATTERA) 40 MG capsule Take 1 capsule (40 mg total) by mouth daily. 30 capsule 2  . buPROPion (WELLBUTRIN XL) 150 MG 24 hr tablet Take 1 tablet (150 mg total) by mouth every morning. 30 tablet 2  . hydrOXYzine (ATARAX/VISTARIL) 10 MG tablet Take 1 tablet (10 mg total) by mouth 3 (three) times daily as needed. 30 tablet 2  . Lactobacillus (LACTINEX) PACK Mix 1/2 packet with soft food and take twice a day for 5 days. 5 each 0  . pantoprazole (PROTONIX) 40 MG tablet Take 1 tablet (40 mg total) by mouth daily. (Patient not taking: Reported on 05/13/2016) 30 tablet 0  . promethazine (PHENERGAN) 25 MG tablet Take 1 tablet (25 mg total) by mouth every 6 (six) hours as needed for nausea or vomiting. 12 tablet 0  . traZODone (DESYREL) 50 MG tablet Take 1 tablet (50 mg total) by mouth at bedtime as needed for sleep. 30 tablet 2   No current facility-administered medications for this visit.     Musculoskeletal: Strength & Muscle Tone: Unable to assess due to telephone visit Gait & Station: Unable to assess due to telephone visit Patient leans: N/A  Psychiatric Specialty Exam: Review of Systems  There were no vitals taken for this visit.There is no height or weight on file to calculate BMI.  General Appearance: Unable to assess due to telephone visit  Eye Contact:  Unable to assess due to telephone visit  Speech:  Clear and Coherent and Normal Rate  Volume:  Normal  Mood:  Euthymic  Affect:  Appropriate and Congruent  Thought Process:  Coherent, Goal Directed and Linear  Orientation:  Full (Time, Place, and Person)  Thought Content: WDL and Logical    Suicidal Thoughts:  No  Homicidal Thoughts:  No  Memory:  Immediate;   Good Recent;   Good Remote;   Good  Judgement:  Good  Insight:  Good  Psychomotor Activity:  Normal  Concentration:  Concentration: Good and Attention Span: Good  Recall:  Good  Fund of Knowledge: Good  Language: Good  Akathisia:  No  Handed:  Right  AIMS (if indicated): Not done  Assets:  Communication Skills Desire for Improvement Financial Resources/Insurance Housing Social Support  ADL's:  Intact  Cognition: WNL  Sleep:  Good   Screenings: GAD-7   Flowsheet Row [facility]27/2020 [facility]  Total GAD-7 Score 0 12    PHQ2-9   Flowsheet Row Video [facility] 07/26/2020 in [facility]  Video Visit from 04/27/2020 in [facility]  PHQ-2 Total Score 0 0  PHQ-9 Total Score 0 9    Flowsheet Row Video Visit from 07/26/2020 in [facility]  C-SSRS RISK CATEGORY No Risk       Assessment and Plan: Patient notes that he is doing well on his current medication regimen. No medication changes made today. Patient will continue all medications as prescribed.   1. Attention deficit hyperactivity disorder (ADHD), predominantly inattentive type  Continue- atomoxetine (STRATTERA) 40 MG capsule; Take 1 capsule (40 mg total) by mouth daily.  Dispense: 30 capsule; Refill: 2 Continue- buPROPion (WELLBUTRIN XL) 150 MG 24 hr tablet; Take 1 tablet (150 mg total) by mouth every morning.  Dispense: 30 tablet; Refill: 2  2. Moderate episode of recurrent major depressive disorder (HCC)  Continue- buPROPion (WELLBUTRIN XL) 150 MG 24 hr tablet; Take 1 tablet (150 mg total) by mouth every morning.  Dispense: 30 tablet; Refill: 2 Continue- traZODone (DESYREL) 50 MG tablet; Take 1 tablet (50 mg total) by mouth at bedtime as needed for  sleep.  Dispense: 30 tablet; Refill: 2  3. Generalized anxiety disorder  Continue- hydrOXYzine (ATARAX/VISTARIL) 10 MG tablet; Take 1 tablet (10 mg total) by mouth 3 (three) times daily as needed.  Dispense: 30 tablet; Refill: 2   Follow-up in 3 months  Shanna Cisco, NP 07/26/2020, 1:48 PM

## 2020-10-20 ENCOUNTER — Other Ambulatory Visit: Payer: Self-pay

## 2020-10-20 ENCOUNTER — Telehealth (INDEPENDENT_AMBULATORY_CARE_PROVIDER_SITE_OTHER): Payer: No Payment, Other | Admitting: Psychiatry

## 2020-10-20 ENCOUNTER — Encounter (HOSPITAL_COMMUNITY): Payer: Self-pay | Admitting: Psychiatry

## 2020-10-20 DIAGNOSIS — F331 Major depressive disorder, recurrent, moderate: Secondary | ICD-10-CM | POA: Diagnosis not present

## 2020-10-20 DIAGNOSIS — F411 Generalized anxiety disorder: Secondary | ICD-10-CM | POA: Diagnosis not present

## 2020-10-20 DIAGNOSIS — F900 Attention-deficit hyperactivity disorder, predominantly inattentive type: Secondary | ICD-10-CM | POA: Diagnosis not present

## 2020-10-20 MED ORDER — BUPROPION HCL ER (XL) 150 MG PO TB24
ORAL_TABLET | Freq: Every morning | ORAL | 2 refills | Status: DC
  Filled 2020-10-20: qty 30, 30d supply, fill #0

## 2020-10-20 MED ORDER — HYDROXYZINE HCL 10 MG PO TABS
ORAL_TABLET | Freq: Three times a day (TID) | ORAL | 2 refills | Status: DC | PRN
  Filled 2020-10-20: qty 30, 10d supply, fill #0

## 2020-10-20 MED ORDER — TRAZODONE HCL 50 MG PO TABS
ORAL_TABLET | Freq: Every evening | ORAL | 2 refills | Status: DC | PRN
  Filled 2020-10-20: qty 30, 30d supply, fill #0

## 2020-10-20 MED ORDER — ATOMOXETINE HCL 40 MG PO CAPS
ORAL_CAPSULE | Freq: Every day | ORAL | 2 refills | Status: DC
  Filled 2020-10-20: qty 30, 30d supply, fill #0
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
## 2020-10-27 ENCOUNTER — Other Ambulatory Visit: Payer: Self-pay

## 2021-01-19 ENCOUNTER — Other Ambulatory Visit: Payer: Self-pay

## 2021-01-19 ENCOUNTER — Telehealth (INDEPENDENT_AMBULATORY_CARE_PROVIDER_SITE_OTHER): Payer: No Payment, Other | Admitting: Psychiatry

## 2021-01-19 ENCOUNTER — Encounter (HOSPITAL_COMMUNITY): Payer: Self-pay | Admitting: Psychiatry

## 2021-01-19 DIAGNOSIS — F900 Attention-deficit hyperactivity disorder, predominantly inattentive type: Secondary | ICD-10-CM | POA: Diagnosis not present

## 2021-01-19 DIAGNOSIS — F411 Generalized anxiety disorder: Secondary | ICD-10-CM | POA: Diagnosis not present

## 2021-01-19 DIAGNOSIS — F331 Major depressive disorder, recurrent, moderate: Secondary | ICD-10-CM | POA: Diagnosis not present

## 2021-01-19 MED ORDER — BUPROPION HCL ER (XL) 150 MG PO TB24
ORAL_TABLET | Freq: Every morning | ORAL | 3 refills | Status: DC
Start: 2021-01-19 — End: 2021-04-25
  Filled 2021-01-19: qty 30, 30d supply, fill #0

## 2021-01-19 MED ORDER — TRAZODONE HCL 50 MG PO TABS
ORAL_TABLET | Freq: Every evening | ORAL | 32 refills | Status: DC | PRN
  Filled 2021-01-19: qty 30, 30d supply, fill #0

## 2021-01-19 MED ORDER — HYDROXYZINE HCL 10 MG PO TABS
ORAL_TABLET | Freq: Three times a day (TID) | ORAL | 32 refills | Status: DC | PRN
  Filled 2021-01-19: qty 30, 10d supply, fill #0

## 2021-01-19 MED ORDER — ATOMOXETINE HCL 40 MG PO CAPS
ORAL_CAPSULE | Freq: Every day | ORAL | 3 refills | Status: DC
  Filled 2021-01-19: qty 30, 30d supply, fill #0

## 2021-01-19 NOTE — Progress Notes (Signed)
BH MD/PA/NP OP Progress Note  Virtual Visit via Telephone Note  I connected with Antonio Maldonado on 01/19/21 at  1:30 PM EDT by telephone and verified that I am speaking with the correct person using two identifiers.  Location: Patient: home Provider: Clinic   I discussed the limitations, risks, security and privacy concerns of performing an evaluation and management service by telephone and the availability of in person appointments. I also discussed with the patient that there may be a patient responsible charge related to this service. The patient expressed understanding and agreed to proceed.   I provided 30 minutes of non-face-to-face time during this encounter.      01/19/2021 2:00 PM [name]  MRN:  [id number]  Chief Complaint:  "I think a lot about my future"  HPI:  25 year old male seen today for follow up psychiatric evaluation.  He has a psychiatric history of PTSD, bipolar disorder, and ADHD.  He is currently managed on Strattera 40 mg daily, Wellbutrin XL 150 mg daily, hydroxyzine 10 mg 3 times daily, and trazodone 50 mg nightly.  He notes that his medications are effective in managing his psychiatric conditions.  Today he was unable to login virtually so his exam was done over the phone. During assessment he was pleasant, cooperative, and engaged in conversation.  He informed Probation officer that at times he becomes anxious because he thinks a lot about his future.  He notes that he internalizes his feelings and is unable to speak to others about it because it is difficult for him to explain.  Provider conducted a GAD-7 and patient scored a 6, at his last visit he scored a 5.  Provider asked patient if he would like to speak to a therapist to address how to express himself.  He notes that he would.  An appointment set for November 11th.  Patient notes that he has minimal depression and scored a 3 on his PHQ-9, at his last visit he scored a 0.  He endorses increased appetite and notes that he  has gained 30 pounds.  Today he denies SI/HI/VAH, mania, and paranoia.     Patient notes that he has met his son and reports that he is trying to get along with his child's mother so that he can be a part of his son's life.   Overall patient notes that he is doing well mentally.  No medication changes made today.  Patient referred to outpatient counseling for therapy.  No other concerns noted at this time. Visit Diagnosis:    ICD-10-CM   1. Moderate episode of recurrent major depressive disorder (HCC)  F33.1 traZODone (DESYREL) 50 MG tablet    buPROPion (WELLBUTRIN XL) 150 MG 24 hr tablet    Ambulatory referral to Social Work    2. Generalized anxiety disorder  F41.1 hydrOXYzine (ATARAX/VISTARIL) 10 MG tablet    Ambulatory referral to Social Work    3. Attention deficit hyperactivity disorder (ADHD), predominantly inattentive type  F90.0 buPROPion (WELLBUTRIN XL) 150 MG 24 hr tablet    atomoxetine (STRATTERA) 40 MG capsule    Ambulatory referral to Social Work      Past Psychiatric History:  PTSD, bipolar disorder, and ADHD  Past Medical History:  Past Medical History:  Diagnosis Date   Bipolar 1 disorder ([place])    Depression    Post traumatic stress disorder    History reviewed. No pertinent surgical history.  Family Psychiatric History: Denies (notes that he was in foster care until 3 and  then he was adopted however notes that it didn't work out)  Family History: History reviewed. No pertinent family history.  Social History:  Social History   Socioeconomic History   Marital status: Single    Spouse name: Not on file   Number of children: Not on file   Years of education: Not on file   Highest education level: Not on file  Occupational History   Not on file  Tobacco Use   Smoking status: Some Days   Smokeless tobacco: Never  Substance and Sexual Activity   Alcohol use: No   Drug use: No   Sexual activity: Not on file  Other Topics Concern   Not on file  Social  History Narrative   Not on file   Social Determinants of Health   Financial Resource Strain: Not on file  Food Insecurity: Not on file  Transportation Needs: Not on file  Physical Activity: Not on file  Stress: Not on file  Social Connections: Not on file    Allergies: No Known Allergies  Metabolic Disorder Labs: No results found for: HGBA1C, MPG No results found for: PROLACTIN No results found for: CHOL, TRIG, HDL, CHOLHDL, VLDL, LDLCALC No results found for: TSH  Therapeutic Level Labs: No results found for: LITHIUM Lab Results  Component Value Date   VALPROATE 57.2 06/20/2012   No components found for:  CBMZ  Current Medications: Current Outpatient Medications  Medication Sig Dispense Refill   atomoxetine (STRATTERA) 40 MG capsule TAKE 1 CAPSULE (40 MG TOTAL) BY MOUTH DAILY. 30 capsule 3   buPROPion (WELLBUTRIN XL) 150 MG 24 hr tablet TAKE 1 TABLET (150 MG TOTAL) BY MOUTH EVERY MORNING. 30 tablet 3   hydrOXYzine (ATARAX/VISTARIL) 10 MG tablet TAKE 1 TABLET (10 MG TOTAL) BY MOUTH 3 (THREE) TIMES DAILY AS NEEDED. 30 tablet 32   Lactobacillus (LACTINEX) PACK Mix 1/2 packet with soft food and take twice a day for 5 days. 5 each 0   pantoprazole (PROTONIX) 40 MG tablet Take 1 tablet (40 mg total) by mouth daily. (Patient not taking: Reported on 05/13/2016) 30 tablet 0   promethazine (PHENERGAN) 25 MG tablet Take 1 tablet (25 mg total) by mouth every 6 (six) hours as needed for nausea or vomiting. 12 tablet 0   traZODone (DESYREL) 50 MG tablet TAKE 1 TABLET (50 MG TOTAL) BY MOUTH AT BEDTIME AS NEEDED FOR SLEEP. 30 tablet 32   No current facility-administered medications for this visit.     Musculoskeletal: Strength & Muscle Tone:  Unable to assess due to telephone visit Gait & Station:  Unable to assess due to telephone visit Patient leans: N/A  Psychiatric Specialty Exam: Review of Systems  There were no vitals taken for this visit.There is no height or weight on file  to calculate BMI.  General Appearance:  Unable to assess due to telephone visit  Eye Contact:   Unable to assess due to telephone visit  Speech:  Clear and Coherent and Normal Rate  Volume:  Normal  Mood:  Euthymic  Affect:  Appropriate and Congruent  Thought Process:  Coherent, Goal Directed and Linear  Orientation:  Full (Time, Place, and Person)  Thought Content: WDL and Logical   Suicidal Thoughts:  No  Homicidal Thoughts:  No  Memory:  Immediate;   Good Recent;   Good Remote;   Good  Judgement:  Good  Insight:  Good  Psychomotor Activity:  Normal  Concentration:  Concentration: Good and Attention Span: Good  Recall:  Good  Fund of Knowledge: Good  Language: Good  Akathisia:  No  Handed:  Right  AIMS (if indicated): Not done  Assets:  Communication Skills Desire for Improvement Financial Resources/Insurance Housing Social Support  ADL's:  Intact  Cognition: WNL  Sleep:  Good   Screenings: GAD-7    Flowsheet Row Video Visit from 01/19/2021 in [facility] Video Visit from 10/20/2020 in [facility] Video Visit from 07/26/2020 in [facility] Video Visit from 04/27/2020 in [facility]  Total GAD-7 Score 6 5 0 12      PHQ2-9    Flowsheet Row Video Visit from 01/19/2021 in [facility] Video Visit from 10/20/2020 in [facility] Video Visit from 07/26/2020 in [facility] Video Visit from 04/27/2020 in [facility]  PHQ-2 Total Score 0 0 0 0  PHQ-9 Total Score -- 0 0 9      Flowsheet Row Video Visit from 07/26/2020 in [place] No Risk        Assessment and Plan: Patient notes that he is doing well on his current medication regimen.  He however notes he has difficulty expressing himself and was  referred to outpatient counseling for therapy.  No medication changes made today. Patient will continue all medications as prescribed.   1. Attention deficit hyperactivity disorder (ADHD), predominantly inattentive type  Continue- atomoxetine (STRATTERA) 40 MG capsule; Take 1 capsule (40 mg total) by mouth daily.  Dispense: 30 capsule; Refill: 3 Continue- buPROPion (WELLBUTRIN XL) 150 MG 24 hr tablet; Take 1 tablet (150 mg total) by mouth every morning.  Dispense: 30 tablet; Refill: 3 - Ambulatory referral to Social Work 2. Moderate episode of recurrent major depressive disorder (HCC)  Continue- buPROPion (WELLBUTRIN XL) 150 MG 24 hr tablet; Take 1 tablet (150 mg total) by mouth every morning.  Dispense: 30 tablet; Refill: 3 Continue- traZODone (DESYREL) 50 MG tablet; Take 1 tablet (50 mg total) by mouth at bedtime as needed for sleep.  Dispense: 30 tablet; Refill: 3 - Ambulatory referral to Social Work 3. Generalized anxiety disorder  Continue- hydrOXYzine (ATARAX/VISTARIL) 10 MG tablet; Take 1 tablet (10 mg total) by mouth 3 (three) times daily as needed.  Dispense: 30 tablet; Refill: 3 - Ambulatory referral to Social Work  Follow-up in 3 months Follow-up with therapy  Salley Slaughter, NP 01/19/2021, 2:00 PM

## 2021-01-26 ENCOUNTER — Other Ambulatory Visit: Payer: Self-pay

## 2021-03-21 ENCOUNTER — Ambulatory Visit (HOSPITAL_COMMUNITY): Payer: No Payment, Other | Admitting: Licensed Clinical Social Worker

## 2021-04-25 ENCOUNTER — Encounter (HOSPITAL_COMMUNITY): Payer: Self-pay | Admitting: Psychiatry

## 2021-04-25 ENCOUNTER — Telehealth (INDEPENDENT_AMBULATORY_CARE_PROVIDER_SITE_OTHER): Payer: No Payment, Other | Admitting: Psychiatry

## 2021-04-25 ENCOUNTER — Other Ambulatory Visit: Payer: Self-pay

## 2021-04-25 DIAGNOSIS — F900 Attention-deficit hyperactivity disorder, predominantly inattentive type: Secondary | ICD-10-CM | POA: Diagnosis not present

## 2021-04-25 DIAGNOSIS — F411 Generalized anxiety disorder: Secondary | ICD-10-CM

## 2021-04-25 DIAGNOSIS — F331 Major depressive disorder, recurrent, moderate: Secondary | ICD-10-CM

## 2021-04-25 MED ORDER — ATOMOXETINE HCL 40 MG PO CAPS
ORAL_CAPSULE | Freq: Every day | ORAL | 3 refills | Status: DC
  Filled 2021-04-25: qty 30, 30d supply, fill #0

## 2021-04-25 MED ORDER — TRAZODONE HCL 50 MG PO TABS
50.0000 mg | ORAL_TABLET | Freq: Every evening | ORAL | 3 refills | Status: DC | PRN
  Filled 2021-04-25: qty 30, 30d supply, fill #0

## 2021-04-25 MED ORDER — HYDROXYZINE HCL 10 MG PO TABS
10.0000 mg | ORAL_TABLET | Freq: Three times a day (TID) | ORAL | 3 refills | Status: DC | PRN
Start: 2021-04-25 — End: 2021-11-21
  Filled 2021-04-25: qty 30, 10d supply, fill #0

## 2021-04-25 MED ORDER — BUPROPION HCL ER (XL) 150 MG PO TB24
ORAL_TABLET | Freq: Every morning | ORAL | 3 refills | Status: DC
  Filled 2021-04-25: qty 30, 30d supply, fill #0

## 2021-04-25 NOTE — Progress Notes (Addendum)
BH MD/PA/NP OP Progress Note  Virtual Visit via Telephone Note  I connected with Antonio Maldonado on 04/25/21 at  3:00 PM EST by telephone and verified that I am speaking with the correct person using two identifiers.  Location: Patient: Store Provider: Home office   I discussed the limitations, risks, security and privacy concerns of performing an evaluation and management service by telephone and the availability of in person appointments. I also discussed with the patient that there may be a patient responsible charge related to this service. The patient expressed understanding and agreed to proceed.   I provided 30 minutes of non-face-to-face time during this encounter.      04/25/2021 2:56 PM Antonio Maldonado  MRN:  [id number]  Chief Complaint:  "I haven't had my medications in a month"  HPI:  25 year old male seen today for follow up psychiatric evaluation.  He has a psychiatric history of PTSD, bipolar disorder, and ADHD.  He is currently managed on Strattera 40 mg daily, Wellbutrin XL 150 mg daily, hydroxyzine 10 mg 3 times daily, and trazodone 50 mg nightly.  He notes that his medications he has not had his medications in a month  Today he was unable to login virtually so his exam was done over the phone. During assessment he was pleasant, cooperative, and engaged in conversation.  He informed Clinical research associate that since being without his medications he has been more irritable, anxious, and depressed. Patient notes that he was shopping and had limited time to speaks so he requested his GAD 7 and PHQ 9 be conducted at a later date. Patient endorses increased appetite and adequate sleep. Today he denies SI/HI/VAH, mania, or paranoia.   Recently patient notes that he has been having pain in his feet. Patient informed that he can schedule an appointment with [facility]. Patient also notes that he wants to see a dentist who take medicaid. Provider instructed patient to look dentist up  online. He endorsed understanding and agreed.   At this time no medication changes made. Patient will restart medications and take them as prescribed. No other concerns noted at this time.    Visit Diagnosis:    ICD-10-CM   1. Attention deficit hyperactivity disorder (ADHD), predominantly inattentive type  F90.0 atomoxetine (STRATTERA) 40 MG capsule    buPROPion (WELLBUTRIN XL) 150 MG 24 hr tablet    2. Moderate episode of recurrent major depressive disorder (HCC)  F33.1 buPROPion (WELLBUTRIN XL) 150 MG 24 hr tablet    traZODone (DESYREL) 50 MG tablet    3. Generalized anxiety disorder  F41.1 hydrOXYzine (ATARAX) 10 MG tablet      Past Psychiatric History:  PTSD, bipolar disorder, and ADHD  Past Medical History:  Past Medical History:  Diagnosis Date   Bipolar 1 disorder ([facility])    Depression    Post traumatic stress disorder    No past surgical history on file.  Family Psychiatric History: Denies (notes that he was in foster care until 3 and then he was adopted however notes that it didn't work out)  Family History: No family history on file.  Social History:  Social History   Socioeconomic History   Marital status: Single    Spouse name: Not on file   Number of children: Not on file   Years of education: Not on file   Highest education level: Not on file  Occupational History   Not on file  Tobacco Use   Smoking status: Some Days   Smokeless  tobacco: Never  Substance and Sexual Activity   Alcohol use: No   Drug use: No   Sexual activity: Not on file  Other Topics Concern   Not on file  Social History Narrative   Not on file   Social Determinants of Health   Financial Resource Strain: Not on file  Food Insecurity: Not on file  Transportation Needs: Not on file  Physical Activity: Not on file  Stress: Not on file  Social Connections: Not on file    Allergies: No Known Allergies  Metabolic Disorder Labs: No results found for: HGBA1C, MPG No results  found for: PROLACTIN No results found for: CHOL, TRIG, HDL, CHOLHDL, VLDL, LDLCALC No results found for: TSH  Therapeutic Level Labs: No results found for: LITHIUM Lab Results  Component Value Date   VALPROATE 57.2 06/20/2012   No components found for:  CBMZ  Current Medications: Current Outpatient Medications  Medication Sig Dispense Refill   atomoxetine (STRATTERA) 40 MG capsule TAKE 1 CAPSULE (40 MG TOTAL) BY MOUTH DAILY. 30 capsule 3   buPROPion (WELLBUTRIN XL) 150 MG 24 hr tablet TAKE 1 TABLET (150 MG TOTAL) BY MOUTH EVERY MORNING. 30 tablet 3   hydrOXYzine (ATARAX) 10 MG tablet Take 1 tablet (10 mg total) by mouth 3 (three) times daily as needed. 30 tablet 3   Lactobacillus (LACTINEX) PACK Mix 1/2 packet with soft food and take twice a day for 5 days. 5 each 0   pantoprazole (PROTONIX) 40 MG tablet Take 1 tablet (40 mg total) by mouth daily. (Patient not taking: Reported on 05/13/2016) 30 tablet 0   promethazine (PHENERGAN) 25 MG tablet Take 1 tablet (25 mg total) by mouth every 6 (six) hours as needed for nausea or vomiting. 12 tablet 0   traZODone (DESYREL) 50 MG tablet Take 1 tablet (50 mg total) by mouth at bedtime as needed for sleep. 30 tablet 3   No current facility-administered medications for this visit.     Musculoskeletal: Strength & Muscle Tone:  Unable to assess due to telephone visit Gait & Station:  Unable to assess due to telephone visit Patient leans: N/A  Psychiatric Specialty Exam: Review of Systems  There were no vitals taken for this visit.There is no height or weight on file to calculate BMI.  General Appearance:  Unable to assess due to telephone visit  Eye Contact:   Unable to assess due to telephone visit  Speech:  Clear and Coherent and Normal Rate  Volume:  Normal  Mood:  Anxious and Depressed  Affect:  Appropriate and Congruent  Thought Process:  Coherent, Goal Directed and Linear  Orientation:  Full (Time, Place, and Person)  Thought  Content: WDL and Logical   Suicidal Thoughts:  No  Homicidal Thoughts:  No  Memory:  Immediate;   Good Recent;   Good Remote;   Good  Judgement:  Good  Insight:  Good  Psychomotor Activity:  Normal  Concentration:  Concentration: Good and Attention Span: Good  Recall:  Good  Fund of Knowledge: Good  Language: Good  Akathisia:  No  Handed:  Right  AIMS (if indicated): Not done  Assets:  Communication Skills Desire for Improvement Financial Resources/Insurance Housing Social Support  ADL's:  Intact  Cognition: WNL  Sleep:  Good   Screenings: GAD-7    Flowsheet Row Video Visit from 01/19/2021 in [facility] Video Visit from 10/20/2020 in [facility] Video Visit from 07/26/2020 in [facility] Video Visit from 04/27/2020 in [facility]  Total GAD-7 Score 6 5 0 12      PHQ2-9    Flowsheet Row Video Visit from 01/19/2021 in [facility] Video Visit from 10/20/2020 in [facility] Video Visit from 07/26/2020 in [facility] Video Visit from 04/27/2020 in [facility]  PHQ-2 Total Score 0 0 0 0  PHQ-9 Total Score -- 0 0 9      Flowsheet Row Video Visit from 07/26/2020 in [facility]  C-SSRS RISK CATEGORY No Risk        Assessment and Plan: Patient notes that he has been out of his medications for a month and has been more anxious and depressed. Today he is agreeable to restarting all medications.  Patient informed that he can be seen at [facility] for primary care.   1. Attention deficit hyperactivity disorder (ADHD), predominantly inattentive type  Restart- atomoxetine (STRATTERA) 40 MG capsule; TAKE 1 CAPSULE (40 MG TOTAL) BY MOUTH DAILY.  Dispense: 30 capsule; Refill: 3 Restart- buPROPion (WELLBUTRIN XL) 150 MG 24 hr tablet; TAKE 1  TABLET (150 MG TOTAL) BY MOUTH EVERY MORNING.  Dispense: 30 tablet; Refill: 3  2. Moderate episode of recurrent major depressive disorder (HCC)  Restart- buPROPion (WELLBUTRIN XL) 150 MG 24 hr tablet; TAKE 1 TABLET (150 MG TOTAL) BY MOUTH EVERY MORNING.  Dispense: 30 tablet; Refill: 3 Restart- traZODone (DESYREL) 50 MG tablet; Take 1 tablet (50 mg total) by mouth at bedtime as needed for sleep.  Dispense: 30 tablet; Refill: 3  3. Generalized anxiety disorder  Restart- hydrOXYzine (ATARAX) 10 MG tablet; Take 1 tablet (10 mg total) by mouth 3 (three) times daily as needed.  Dispense: 30 tablet; Refill: 3   Follow-up in 3 months Follow-up with therapy  Shanna Cisco, NP 04/25/2021, 2:56 PM

## 2021-05-02 ENCOUNTER — Other Ambulatory Visit: Payer: Self-pay

## 2021-07-13 ENCOUNTER — Telehealth (HOSPITAL_COMMUNITY): Payer: Self-pay | Admitting: Psychiatry

## 2021-07-20 ENCOUNTER — Telehealth (HOSPITAL_COMMUNITY): Payer: No Payment, Other | Admitting: Psychiatry

## 2021-07-20 ENCOUNTER — Encounter (HOSPITAL_COMMUNITY): Payer: Self-pay

## 2021-09-19 ENCOUNTER — Telehealth (HOSPITAL_COMMUNITY): Payer: No Payment, Other | Admitting: Psychiatry

## 2021-09-22 ENCOUNTER — Telehealth (HOSPITAL_COMMUNITY): Payer: Self-pay | Admitting: Psychiatry

## 2021-09-27 ENCOUNTER — Telehealth (HOSPITAL_COMMUNITY): Payer: No Payment, Other | Admitting: Psychiatry

## 2021-09-27 ENCOUNTER — Encounter (HOSPITAL_COMMUNITY): Payer: Self-pay

## 2021-11-21 ENCOUNTER — Other Ambulatory Visit: Payer: Self-pay

## 2021-11-21 ENCOUNTER — Telehealth (INDEPENDENT_AMBULATORY_CARE_PROVIDER_SITE_OTHER): Payer: No Payment, Other | Admitting: Psychiatry

## 2021-11-21 DIAGNOSIS — F331 Major depressive disorder, recurrent, moderate: Secondary | ICD-10-CM

## 2021-11-21 DIAGNOSIS — F900 Attention-deficit hyperactivity disorder, predominantly inattentive type: Secondary | ICD-10-CM | POA: Diagnosis not present

## 2021-11-21 DIAGNOSIS — F411 Generalized anxiety disorder: Secondary | ICD-10-CM

## 2021-11-21 MED ORDER — BUPROPION HCL ER (XL) 150 MG PO TB24
ORAL_TABLET | Freq: Every morning | ORAL | 3 refills | Status: AC
  Filled 2021-11-21: qty 30, 30d supply, fill #0
  Filled 2022-04-16: qty 30, 30d supply, fill #1

## 2021-11-21 MED ORDER — HYDROXYZINE HCL 10 MG PO TABS
10.0000 mg | ORAL_TABLET | Freq: Three times a day (TID) | ORAL | 3 refills | Status: AC | PRN
Start: 2021-11-21 — End: ?
  Filled 2021-11-21: qty 30, 10d supply, fill #0
  Filled 2022-04-16: qty 30, 10d supply, fill #1

## 2021-11-21 MED ORDER — TRAZODONE HCL 50 MG PO TABS
50.0000 mg | ORAL_TABLET | Freq: Every evening | ORAL | 3 refills | Status: AC | PRN
  Filled 2021-11-21: qty 30, 30d supply, fill #0
  Filled 2022-04-16: qty 30, 30d supply, fill #1

## 2021-11-21 MED ORDER — ATOMOXETINE HCL 40 MG PO CAPS
ORAL_CAPSULE | Freq: Every day | ORAL | 3 refills | Status: AC
  Filled 2021-11-21: qty 30, 30d supply, fill #0
  Filled 2022-04-16: qty 30, 30d supply, fill #1

## 2021-11-21 NOTE — Progress Notes (Signed)
BH MD/PA/NP OP Progress Note  Virtual Visit via Video Note  I connected with Antonio Maldonado on 11/21/21 at  3:30 PM EDT by a video enabled telemedicine application and verified that I am speaking with the correct person using two identifiers.  Location: Patient: Home Provider: Clinic   I discussed the limitations of evaluation and management by telemedicine and the availability of in person appointments. The patient expressed understanding and agreed to proceed.  I provided 30 minutes of non-face-to-face time during this encounter.        11/21/2021 3:24 PM Antonio Maldonado  MRN:  [id number]  Chief Complaint:  "I have been having some issues getting my medications  HPI:  26 year old male seen today for follow up psychiatric evaluation.  He has a psychiatric history of PTSD, bipolar disorder, and ADHD.  He is currently managed on Strattera 40 mg daily, Wellbutrin XL 150 mg daily, hydroxyzine 10 mg 3 times daily, and trazodone 50 mg nightly.  He notes that he has been having difficulty obtaining his medication.  Today he was well-groomed, pleasant, cooperative, and engaged in conversation.  He informed Clinical research associate that he has been having issues obtaining his medications.  He notes that he logged in twice during past visits for medication management and notes that provider did not login.  He informed Clinical research associate that he has been without his medications since and reports that he has been increasingly anxious.  Provider conducted a GAD-7 and patient scored a 17.  He notes that he worries about his finances, his 2 jobs, his housing (noting that he currently does not have stable housing, and his son.  Provider gave patient resources to [facility] to help with housing.  He endorses adequate sleep and increased appetite.  He informed Clinical research associate that since his last visit he has gained 30 pounds.  Today he denies SI/HI/VAH, mania, paranoia.  At this time no medication changes made. Patient will restart medications and  take them as prescribed. No other concerns noted at this time.    Visit Diagnosis:    ICD-10-CM   1. Attention deficit hyperactivity disorder (ADHD), predominantly inattentive type  F90.0 atomoxetine (STRATTERA) 40 MG capsule    buPROPion (WELLBUTRIN XL) 150 MG 24 hr tablet    2. Moderate episode of recurrent major depressive disorder (HCC)  F33.1 buPROPion (WELLBUTRIN XL) 150 MG 24 hr tablet    traZODone (DESYREL) 50 MG tablet    3. Generalized anxiety disorder  F41.1 hydrOXYzine (ATARAX) 10 MG tablet      Past Psychiatric History:  PTSD, bipolar disorder, and ADHD  Past Medical History:  Past Medical History:  Diagnosis Date   Bipolar 1 disorder (HCC)    Depression    Post traumatic stress disorder    No past surgical history on file.  Family Psychiatric History: Denies (notes that he was in foster care until 3 and then he was adopted however notes that it didn't work out)  Family History: No family history on file.  Social History:  Social History   Socioeconomic History   Marital status: Single    Spouse name: Not on file   Number of children: Not on file   Years of education: Not on file   Highest education level: Not on file  Occupational History   Not on file  Tobacco Use   Smoking status: Some Days   Smokeless tobacco: Never  Substance and Sexual Activity   Alcohol use: No   Drug use: No   Sexual activity:  Not on file  Other Topics Concern   Not on file  Social History Narrative   Not on file   Social Determinants of Health   Financial Resource Strain: Not on file  Food Insecurity: Not on file  Transportation Needs: Not on file  Physical Activity: Not on file  Stress: Not on file  Social Connections: Not on file    Allergies: No Known Allergies  Metabolic Disorder Labs: No results found for: "HGBA1C", "MPG" No results found for: "PROLACTIN" No results found for: "CHOL", "TRIG", "HDL", "CHOLHDL", "VLDL", "LDLCALC" No results found for:  "TSH"  Therapeutic Level Labs: No results found for: "LITHIUM" Lab Results  Component Value Date   VALPROATE 57.2 06/20/2012   No results found for: "CBMZ"  Current Medications: Current Outpatient Medications  Medication Sig Dispense Refill   atomoxetine (STRATTERA) 40 MG capsule TAKE 1 CAPSULE (40 MG TOTAL) BY MOUTH DAILY. 30 capsule 3   buPROPion (WELLBUTRIN XL) 150 MG 24 hr tablet TAKE 1 TABLET (150 MG TOTAL) BY MOUTH EVERY MORNING. 30 tablet 3   hydrOXYzine (ATARAX) 10 MG tablet Take 1 tablet (10 mg total) by mouth 3 (three) times daily as needed. 30 tablet 3   Lactobacillus (LACTINEX) PACK Mix 1/2 packet with soft food and take twice a day for 5 days. 5 each 0   pantoprazole (PROTONIX) 40 MG tablet Take 1 tablet (40 mg total) by mouth daily. (Patient not taking: Reported on 05/13/2016) 30 tablet 0   promethazine (PHENERGAN) 25 MG tablet Take 1 tablet (25 mg total) by mouth every 6 (six) hours as needed for nausea or vomiting. 12 tablet 0   traZODone (DESYREL) 50 MG tablet Take 1 tablet (50 mg total) by mouth at bedtime as needed for sleep. 30 tablet 3   No current facility-administered medications for this visit.     Musculoskeletal: Strength & Muscle Tone: within normal limits and  telehealth visit Gait & Station: normal,  telehealth visit Patient leans: N/A  Psychiatric Specialty Exam: Review of Systems  There were no vitals taken for this visit.There is no height or weight on file to calculate BMI.  General Appearance: Well Groomed  Eye Contact:  Good  Speech:  Clear and Coherent and Normal Rate  Volume:  Normal  Mood:  Anxious and Depressed  Affect:  Appropriate and Congruent  Thought Process:  Coherent, Goal Directed and Linear  Orientation:  Full (Time, Place, and Person)  Thought Content: WDL and Logical   Suicidal Thoughts:  No  Homicidal Thoughts:  No  Memory:  Immediate;   Good Recent;   Good Remote;   Good  Judgement:  Good  Insight:  Good  Psychomotor  Activity:  Normal  Concentration:  Concentration: Good and Attention Span: Good  Recall:  Good  Fund of Knowledge: Good  Language: Good  Akathisia:  No  Handed:  Right  AIMS (if indicated): Not done  Assets:  Communication Skills Desire for Improvement Financial Resources/Insurance Housing Social Support  ADL's:  Intact  Cognition: WNL  Sleep:  Good   Screenings: GAD-7    Flowsheet Row Video Visit from 11/21/2021 in [facility] Video Visit from 01/19/2021 in [facility] Video Visit from 10/20/2020 in [facility] Video Visit from 07/26/2020 in [facility] Video Visit from 04/27/2020 in [facility]  Total GAD-7 Score 17 6 5  0 12      PHQ2-9  Flowsheet Row Video Visit from 11/21/2021 in [facility] Video Visit from 01/19/2021 in [facility] Video Visit from 10/20/2020 in [facility] Video Visit from 07/26/2020 in [facility] Video Visit from 04/27/2020 in [facility]  PHQ-2 Total Score 3 0 0 0 0  PHQ-9 Total Score 13 -- 0 0 9      Flowsheet Row Video Visit from 07/26/2020 in [facility]  C-SSRS RISK CATEGORY No Risk        Assessment and Plan: Patient notes that he has been out of his medications and has been more anxious and depressed. Today he is agreeable to restarting all medications.     1. Attention deficit hyperactivity disorder (ADHD), predominantly inattentive type  Restart- atomoxetine (STRATTERA) 40 MG capsule; TAKE 1 CAPSULE (40 MG TOTAL) BY MOUTH DAILY.  Dispense: 30 capsule; Refill: 3 Restart- buPROPion (WELLBUTRIN XL) 150 MG 24 hr tablet; TAKE 1 TABLET (150 MG TOTAL) BY MOUTH EVERY MORNING.  Dispense: 30 tablet; Refill: 3  2. Moderate episode of recurrent  major depressive disorder (HCC)  Restart- buPROPion (WELLBUTRIN XL) 150 MG 24 hr tablet; TAKE 1 TABLET (150 MG TOTAL) BY MOUTH EVERY MORNING.  Dispense: 30 tablet; Refill: 3 Restart- traZODone (DESYREL) 50 MG tablet; Take 1 tablet (50 mg total) by mouth at bedtime as needed for sleep.  Dispense: 30 tablet; Refill: 3  3. Generalized anxiety disorder  Restart- hydrOXYzine (ATARAX) 10 MG tablet; Take 1 tablet (10 mg total) by mouth 3 (three) times daily as needed.  Dispense: 30 tablet; Refill: 3   Follow-up in 3 months Follow-up with therapy  Shanna Cisco, NP 11/21/2021, 3:24 PM

## 2021-11-22 ENCOUNTER — Other Ambulatory Visit: Payer: Self-pay

## 2022-02-19 ENCOUNTER — Encounter (HOSPITAL_COMMUNITY): Payer: Self-pay

## 2022-02-26 ENCOUNTER — Telehealth (HOSPITAL_COMMUNITY): Payer: No Payment, Other | Admitting: Psychiatry

## 2022-04-16 ENCOUNTER — Other Ambulatory Visit: Payer: Self-pay

## 2022-04-18 ENCOUNTER — Ambulatory Visit: Payer: Medicaid Other | Admitting: Podiatry

## 2022-04-20 ENCOUNTER — Other Ambulatory Visit: Payer: Self-pay

## 2022-06-04 DIAGNOSIS — F3289 Other specified depressive episodes: Secondary | ICD-10-CM | POA: Diagnosis not present

## 2022-06-19 DIAGNOSIS — F3289 Other specified depressive episodes: Secondary | ICD-10-CM | POA: Diagnosis not present

## 2022-07-02 DIAGNOSIS — F3289 Other specified depressive episodes: Secondary | ICD-10-CM | POA: Diagnosis not present

## 2022-07-17 ENCOUNTER — Telehealth: Payer: Self-pay

## 2022-07-17 NOTE — Telephone Encounter (Signed)
Mychart msg sent

## 2022-07-19 DIAGNOSIS — F3289 Other specified depressive episodes: Secondary | ICD-10-CM | POA: Diagnosis not present

## 2022-08-02 DIAGNOSIS — F3289 Other specified depressive episodes: Secondary | ICD-10-CM | POA: Diagnosis not present

## 2022-09-21 DIAGNOSIS — F3289 Other specified depressive episodes: Secondary | ICD-10-CM | POA: Diagnosis not present

## 2022-10-05 DIAGNOSIS — F3289 Other specified depressive episodes: Secondary | ICD-10-CM | POA: Diagnosis not present

## 2022-10-19 DIAGNOSIS — F3289 Other specified depressive episodes: Secondary | ICD-10-CM | POA: Diagnosis not present

## 2022-11-02 DIAGNOSIS — F3289 Other specified depressive episodes: Secondary | ICD-10-CM | POA: Diagnosis not present

## 2022-11-23 DIAGNOSIS — F3289 Other specified depressive episodes: Secondary | ICD-10-CM | POA: Diagnosis not present

## 2022-11-30 DIAGNOSIS — F3289 Other specified depressive episodes: Secondary | ICD-10-CM | POA: Diagnosis not present

## 2023-01-10 ENCOUNTER — Encounter (HOSPITAL_COMMUNITY): Payer: Self-pay

## 2023-01-10 ENCOUNTER — Ambulatory Visit (HOSPITAL_COMMUNITY)
Admission: EM | Admit: 2023-01-10 | Discharge: 2023-01-10 | Disposition: A | Discharge status: Home / Self Care | Payer: 59 | Attending: Family Medicine | Admitting: Family Medicine

## 2023-01-10 DIAGNOSIS — N4889 Other specified disorders of penis: Secondary | ICD-10-CM | POA: Diagnosis not present

## 2023-01-10 MED ORDER — METRONIDAZOLE 500 MG PO TABS
2000.0000 mg | ORAL_TABLET | Freq: Once | ORAL | Status: AC
  Administered 2023-01-10: 2000 mg via ORAL

## 2023-01-10 MED ORDER — METRONIDAZOLE 500 MG PO TABS
ORAL_TABLET | ORAL | Status: AC
  Filled 2023-01-10: qty 4

## 2023-01-10 NOTE — Discharge Instructions (Addendum)
We have given you medicine today that would treat trichomonas.  Meds ordered this encounter  Medications   metroNIDAZOLE (FLAGYL) tablet 2,000 mg   We have sent testing for sexually transmitted infections. We will notify you of any positive results once they are received. If required, we will prescribe any medications you might need.  Please refrain from all sexual activity for at least the next seven days.

## 2023-01-10 NOTE — ED Triage Notes (Signed)
Patient states his partner tested positive for BV. Patient having penile irritation and burning. Onset of symptoms a week ago.

## 2023-01-10 NOTE — ED Provider Notes (Signed)
  [facility]   [id number] 01/10/23 Arrival Time: 1021  ASSESSMENT & PLAN:  1. Penile irritation    Meds ordered this encounter  Medications   metroNIDAZOLE (FLAGYL) tablet 2,000 mg      Discharge Instructions      We have given you medicine today that would treat trichomonas.  Meds ordered this encounter  Medications   metroNIDAZOLE (FLAGYL) tablet 2,000 mg   We have sent testing for sexually transmitted infections. We will notify you of any positive results once they are received. If required, we will prescribe any medications you might need.  Please refrain from all sexual activity for at least the next seven days.     Pending: Labs Reviewed  CYTOLOGY, (ORAL, ANAL, URETHRAL) ANCILLARY ONLY    Will notify of any positive results. Instructed to refrain from sexual activity for at least seven days.  Reviewed expectations re: course of current medical issues. Questions answered. Outlined signs and symptoms indicating need for more acute intervention. Patient verbalized understanding. After Visit Summary given.   SUBJECTIVE:  Antonio Maldonado is a 27 y.o. male who presents with complaint of penile irritation. Patient states his partner tested positive for BV or trichomonas; isn't sure. Patient having penile irritation; "something just doesn't feel right down there". Onset of symptoms a week ago.   OBJECTIVE:  Vitals:   01/10/23 1042 01/10/23 1043  BP:  133/77  Pulse:  68  Resp:  18  Temp:  98.5 F (36.9 C)  TempSrc:  Oral  SpO2:  97%  Weight: 113.4 kg   Height: 5\' 6"  (1.676 m)     General appearance: alert, cooperative, appears stated age and no distress Throat: lips, mucosa, and tongue normal; teeth and gums normal Lungs: unlabored respirations; speaks full sentences without difficulty Back: no CVA tenderness; FROM at waist Abdomen: soft, non-tender GU: deferred Skin: warm and dry Psychological: alert and cooperative; normal mood and  affect  Labs Reviewed  CYTOLOGY, (ORAL, ANAL, URETHRAL) ANCILLARY ONLY    No Known Allergies  Past Medical History:  Diagnosis Date   Bipolar 1 disorder (HCC)    Depression    Post traumatic stress disorder    History reviewed. No pertinent family history. Social History   Socioeconomic History   Marital status: Single    Spouse name: Not on file   Number of children: Not on file   Years of education: Not on file   Highest education level: Not on file  Occupational History   Not on file  Tobacco Use   Smoking status: Some Days   Smokeless tobacco: Never  Vaping Use   Vaping status: Never Used  Substance and Sexual Activity   Alcohol use: No   Drug use: No   Sexual activity: Yes    Birth control/protection: None  Other Topics Concern   Not on file  Social History Narrative   Not on file   Social Determinants of Health   Financial Resource Strain: Not on file  Food Insecurity: Not on file  Transportation Needs: Not on file  Physical Activity: Not on file  Stress: Not on file  Social Connections: Not on file  Intimate Partner Violence: Not on file           Mardella Layman, MD 01/10/23 1103

## 2023-01-17 LAB — CYTOLOGY, (ORAL, ANAL, URETHRAL) ANCILLARY ONLY
Chlamydia: NEGATIVE
Comment: NEGATIVE
Comment: NEGATIVE
Comment: NORMAL
Neisseria Gonorrhea: NEGATIVE
Trichomonas: NEGATIVE

## 2023-07-03 ENCOUNTER — Ambulatory Visit: Payer: 59 | Admitting: Podiatry

## 2023-08-19 DIAGNOSIS — F4389 Other reactions to severe stress: Secondary | ICD-10-CM | POA: Diagnosis not present

## 2023-10-09 ENCOUNTER — Telehealth: Admitting: Nurse Practitioner

## 2023-10-09 DIAGNOSIS — K0889 Other specified disorders of teeth and supporting structures: Secondary | ICD-10-CM

## 2023-10-09 NOTE — Progress Notes (Signed)
  Because of the severity of your symptoms and the limitations of virtual visits in prescribing controlled pain medications, I feel your condition warrants further evaluation and I recommend that you be seen in a face-to-face visit.   NOTE: There will be NO CHARGE for this E-Visit   If you are having a true medical emergency, please call 911.     For an urgent face to face visit, [facility] has multiple urgent care centers for your convenience.  Click the link below for the full list of locations and hours, walk-in wait times, appointment scheduling options and driving directions:  Urgent Care - [place], [place], [place], [place], [place], [place]  [place]     Your MyChart E-visit questionnaire answers were reviewed by a board certified advanced clinical practitioner to complete your personal care plan based on your specific symptoms.    Thank you for using e-Visits.

## 2023-10-11 ENCOUNTER — Telehealth: Admitting: Physician Assistant

## 2023-10-11 DIAGNOSIS — K047 Periapical abscess without sinus: Secondary | ICD-10-CM

## 2023-10-11 DIAGNOSIS — F4389 Other reactions to severe stress: Secondary | ICD-10-CM | POA: Diagnosis not present

## 2023-10-11 MED ORDER — AMOXICILLIN-POT CLAVULANATE 875-125 MG PO TABS: 1.0000 | ORAL_TABLET | Freq: Two times a day (BID) | ORAL | 0 refills | Status: AC

## 2023-10-11 MED ORDER — IBUPROFEN 800 MG PO TABS: 800.0000 mg | ORAL_TABLET | Freq: Three times a day (TID) | ORAL | 0 refills | Status: AC | PRN

## 2023-10-11 NOTE — Patient Instructions (Signed)
 Antonio Maldonado, thank you for joining Angelia Kelp, PA-C for today's virtual visit.  While this provider is not your primary care provider (PCP), if your PCP is located in our provider database this encounter information will be shared with them immediately following your visit.   A [facility] MyChart account gives you access to today's visit and all your visits, tests, and labs performed at [facility] " click here if you don't have a [facility] MyChart account or go to [email or web address]  Consent: (Patient) Antonio Yin provided verbal consent for this virtual visit at the beginning of the encounter.  Current Medications:  Current Outpatient Medications:    amoxicillin-clavulanate (AUGMENTIN) 875-125 MG tablet, Take 1 tablet by mouth 2 (two) times daily., Disp: 14 tablet, Rfl: 0   ibuprofen (ADVIL) 800 MG tablet, Take 1 tablet (800 mg total) by mouth every 8 (eight) hours as needed., Disp: 30 tablet, Rfl: 0   atomoxetine  (STRATTERA ) 40 MG capsule, TAKE 1 CAPSULE (40 MG TOTAL) BY MOUTH DAILY., Disp: 30 capsule, Rfl: 3   buPROPion  (WELLBUTRIN  XL) 150 MG 24 hr tablet, TAKE 1 TABLET (150 MG TOTAL) BY MOUTH EVERY MORNING., Disp: 30 tablet, Rfl: 3   hydrOXYzine  (ATARAX ) 10 MG tablet, Take 1 tablet (10 mg total) by mouth 3 (three) times daily as needed., Disp: 30 tablet, Rfl: 3   Lactobacillus (LACTINEX) PACK, Mix 1/2 packet with soft food and take twice a day for 5 days., Disp: 5 each, Rfl: 0   pantoprazole  (PROTONIX ) 40 MG tablet, Take 1 tablet (40 mg total) by mouth daily. (Patient not taking: Reported on 05/13/2016), Disp: 30 tablet, Rfl: 0   promethazine  (PHENERGAN ) 25 MG tablet, Take 1 tablet (25 mg total) by mouth every 6 (six) hours as needed for nausea or vomiting., Disp: 12 tablet, Rfl: 0   traZODone  (DESYREL ) 50 MG tablet, Take 1 tablet (50 mg total) by mouth at bedtime as needed for sleep., Disp: 30 tablet, Rfl: 3   Medications ordered in this encounter:  Meds  ordered this encounter  Medications   amoxicillin-clavulanate (AUGMENTIN) 875-125 MG tablet    Sig: Take 1 tablet by mouth 2 (two) times daily.    Dispense:  14 tablet    Refill:  0    Supervising Provider:   LAMPTEY, PHILIP O [[id number]]   ibuprofen (ADVIL) 800 MG tablet    Sig: Take 1 tablet (800 mg total) by mouth every 8 (eight) hours as needed.    Dispense:  30 tablet    Refill:  0    Supervising Provider:   LAMPTEY, PHILIP O [[id number]]     *If you need refills on other medications prior to your next appointment, please contact your pharmacy*  Follow-Up: Call back or seek an in-person evaluation if the symptoms worsen or if the condition fails to improve as anticipated.  [facility] [phone]  Other Instructions [facility] [phone]   [facility] [phone]   [facility] [phone]   [facility] [phone]   Urgent Dentistry Care [facility] [phone]   [facility] [phone]   [facility] [phone]   [facility] [place] [phone]   Medicaid Children under 21 [facility] (2 locations: [place] and [place])   [place]: [phone]   [place]: [phone]    If you have been instructed to have an in-person evaluation today at a local Urgent Care facility, please use the link below.  It will take you to a list of all of our available [facility] Urgent Cares, including address, phone number and hours of operation. Please do not delay care.  [facility]  If you or a family member do not have a primary care provider, use the link below to schedule a visit and establish care. When you choose a [facility] primary care physician or advanced practice provider, you gain a long-term partner in health. Find a Primary Care Provider  Learn more about [facility]'s in-office and virtual care options: [facility] - Get Care Now

## 2023-10-11 NOTE — Progress Notes (Signed)
 Virtual Visit Consent   Antonio Maldonado, you are scheduled for a virtual visit with a [facility] provider today. Just as with appointments in the office, your consent must be obtained to participate. Your consent will be active for this visit and any virtual visit you may have with one of our providers in the next 365 days. If you have a MyChart account, a copy of this consent can be sent to you electronically.  As this is a virtual visit, video technology does not allow for your provider to perform a traditional examination. This may limit your provider's ability to fully assess your condition. If your provider identifies any concerns that need to be evaluated in person or the need to arrange testing (such as labs, EKG, etc.), we will make arrangements to do so. Although advances in technology are sophisticated, we cannot ensure that it will always work on either your end or our end. If the connection with a video visit is poor, the visit may have to be switched to a telephone visit. With either a video or telephone visit, we are not always able to ensure that we have a secure connection.  By engaging in this virtual visit, you consent to the provision of healthcare and authorize for your insurance to be billed (if applicable) for the services provided during this visit. Depending on your insurance coverage, you may receive a charge related to this service.  I need to obtain your verbal consent now. Are you willing to proceed with your visit today? Antonio Maldonado has provided verbal consent on 10/11/2023 for a virtual visit (video or telephone). Angelia Kelp, PA-C  Date: 10/11/2023 4:32 PM   Virtual Visit via Video Note   I, Angelia Kelp, connected with  Antonio Maldonado  ([id number], 03-25-1996) on 10/11/23 at  4:15 PM EDT by a video-enabled telemedicine application and verified that I am speaking with the correct person using two identifiers.  Location: Patient: Virtual Visit Location Patient:  Home Provider: Virtual Visit Location Provider: Home Office   I discussed the limitations of evaluation and management by telemedicine and the availability of in person appointments. The patient expressed understanding and agreed to proceed.    History of Present Illness: Antonio Maldonado is a 28 y.o. who identifies as a male who was assigned male at birth, and is being seen today for dental pain.  HPI: Dental Pain  This is a new problem. The current episode started more than 1 month ago (3 months ago started and has worsened). The problem occurs constantly. The problem has been rapidly worsening. The pain is moderate. Associated symptoms include facial pain, a fever, sinus pressure and thermal sensitivity. Associated symptoms comments: Headache. He has tried acetaminophen and NSAIDs for the symptoms. The treatment provided no relief.     Problems:  Patient Active Problem List   Diagnosis Date Noted   Moderate episode of recurrent major depressive disorder (HCC) 01/27/2020   Attention deficit hyperactivity disorder (ADHD), predominantly inattentive type 01/27/2020   Generalized anxiety disorder 01/27/2020    Allergies: No Known Allergies Medications:  Current Outpatient Medications:    amoxicillin-clavulanate (AUGMENTIN) 875-125 MG tablet, Take 1 tablet by mouth 2 (two) times daily., Disp: 14 tablet, Rfl: 0   ibuprofen (ADVIL) 800 MG tablet, Take 1 tablet (800 mg total) by mouth every 8 (eight) hours as needed., Disp: 30 tablet, Rfl: 0   atomoxetine  (STRATTERA ) 40 MG capsule, TAKE 1 CAPSULE (40 MG TOTAL) BY MOUTH DAILY., Disp: 30 capsule, Rfl:  3   buPROPion  (WELLBUTRIN  XL) 150 MG 24 hr tablet, TAKE 1 TABLET (150 MG TOTAL) BY MOUTH EVERY MORNING., Disp: 30 tablet, Rfl: 3   hydrOXYzine  (ATARAX ) 10 MG tablet, Take 1 tablet (10 mg total) by mouth 3 (three) times daily as needed., Disp: 30 tablet, Rfl: 3   Lactobacillus (LACTINEX) PACK, Mix 1/2 packet with soft food and take twice a day for 5 days.,  Disp: 5 each, Rfl: 0   pantoprazole  (PROTONIX ) 40 MG tablet, Take 1 tablet (40 mg total) by mouth daily. (Patient not taking: Reported on 05/13/2016), Disp: 30 tablet, Rfl: 0   promethazine  (PHENERGAN ) 25 MG tablet, Take 1 tablet (25 mg total) by mouth every 6 (six) hours as needed for nausea or vomiting., Disp: 12 tablet, Rfl: 0   traZODone  (DESYREL ) 50 MG tablet, Take 1 tablet (50 mg total) by mouth at bedtime as needed for sleep., Disp: 30 tablet, Rfl: 3  Observations/Objective: Patient is well-developed, well-nourished in no acute distress.  Resting comfortably at home.  Head is normocephalic, atraumatic.  No labored breathing.  Speech is clear and coherent with logical content.  Patient is alert and oriented at baseline.    Assessment and Plan: 1. Chronic dental infection (Primary) - amoxicillin-clavulanate (AUGMENTIN) 875-125 MG tablet; Take 1 tablet by mouth 2 (two) times daily.  Dispense: 14 tablet; Refill: 0 - ibuprofen (ADVIL) 800 MG tablet; Take 1 tablet (800 mg total) by mouth every 8 (eight) hours as needed.  Dispense: 30 tablet; Refill: 0  - Suspected infection with broken tooth - Augmentin and ibuprofen prescribed - Can use ice on outside jaw/cheek for swelling - Can also take tylenol for pain with other medications - Discussed DenTemp putty that can be used to cover a broken tooth - Schedule a follow with a dentist as soon as possible (Can contact [facility] if underinsured or uninsured) - Seek in person evaluation if symptoms fail to improve or if they worsen   Follow Up Instructions: I discussed the assessment and treatment plan with the patient. The patient was provided an opportunity to ask questions and all were answered. The patient agreed with the plan and demonstrated an understanding of the instructions.  A copy of instructions were sent to the patient via MyChart unless otherwise noted below.    The patient was advised to call back or seek an  in-person evaluation if the symptoms worsen or if the condition fails to improve as anticipated.    Angelia Kelp, PA-C

## 2023-12-12 ENCOUNTER — Telehealth
